# Patient Record
Sex: Female | Born: 1955 | Race: White | Hispanic: No | Marital: Married | Smoking: Current some day smoker
Health system: Southern US, Community
[De-identification: ages and names within clinical notes are randomized; demographics above are authoritative.]

## PROBLEM LIST (undated history)

## (undated) DIAGNOSIS — C4490 Unspecified malignant neoplasm of skin, unspecified: Secondary | ICD-10-CM

## (undated) DIAGNOSIS — E785 Hyperlipidemia, unspecified: Secondary | ICD-10-CM

## (undated) DIAGNOSIS — F419 Anxiety disorder, unspecified: Secondary | ICD-10-CM

## (undated) DIAGNOSIS — F32A Depression, unspecified: Secondary | ICD-10-CM

## (undated) DIAGNOSIS — J189 Pneumonia, unspecified organism: Secondary | ICD-10-CM

## (undated) DIAGNOSIS — I10 Essential (primary) hypertension: Secondary | ICD-10-CM

## (undated) HISTORY — PX: CHOLECYSTECTOMY: SHX55

## (undated) HISTORY — DX: Hyperlipidemia, unspecified: E78.5

## (undated) HISTORY — PX: LAPAROSCOPIC HYSTERECTOMY: SHX1926

## (undated) HISTORY — DX: Essential (primary) hypertension: I10

## (undated) HISTORY — PX: APPENDECTOMY: SHX54

## (undated) HISTORY — PX: DG GALL BLADDER: HXRAD326

---

## 1998-02-06 ENCOUNTER — Other Ambulatory Visit: Admission: RE | Admit: 1998-02-06 | Discharge: 1998-02-06 | Payer: Self-pay | Admitting: Obstetrics and Gynecology

## 1999-02-24 ENCOUNTER — Other Ambulatory Visit: Admission: RE | Admit: 1999-02-24 | Discharge: 1999-02-24 | Payer: Self-pay | Admitting: Obstetrics and Gynecology

## 2000-02-25 ENCOUNTER — Other Ambulatory Visit: Admission: RE | Admit: 2000-02-25 | Discharge: 2000-02-25 | Payer: Self-pay | Admitting: Obstetrics and Gynecology

## 2002-06-07 ENCOUNTER — Other Ambulatory Visit: Admission: RE | Admit: 2002-06-07 | Discharge: 2002-06-07 | Payer: Self-pay | Admitting: Obstetrics and Gynecology

## 2005-09-02 ENCOUNTER — Other Ambulatory Visit: Admission: RE | Admit: 2005-09-02 | Discharge: 2005-09-02 | Payer: Self-pay | Admitting: Obstetrics and Gynecology

## 2010-02-20 ENCOUNTER — Encounter: Admission: RE | Admit: 2010-02-20 | Discharge: 2010-02-20 | Payer: Self-pay | Admitting: Obstetrics and Gynecology

## 2016-01-23 ENCOUNTER — Telehealth: Payer: Self-pay | Admitting: Acute Care

## 2016-01-23 NOTE — Telephone Encounter (Signed)
Pt does not meet criteria for the lung cancer screening based off of the information provided by patient. Based off questionnaire pt only has a 19 pack year history of smoking which does not meet criteria for program. Will inform referring provider Suella Grove PA Telephone note encounter faxed over to her. Nothing further needed

## 2016-01-23 NOTE — Telephone Encounter (Signed)
Called patient regarding lung cancer screening program. Will sign off message.

## 2016-11-04 DIAGNOSIS — H9201 Otalgia, right ear: Secondary | ICD-10-CM | POA: Diagnosis not present

## 2016-11-20 DIAGNOSIS — H524 Presbyopia: Secondary | ICD-10-CM | POA: Diagnosis not present

## 2017-01-15 DIAGNOSIS — Z23 Encounter for immunization: Secondary | ICD-10-CM | POA: Diagnosis not present

## 2017-01-15 DIAGNOSIS — G43909 Migraine, unspecified, not intractable, without status migrainosus: Secondary | ICD-10-CM | POA: Diagnosis not present

## 2017-01-15 DIAGNOSIS — Z0000 Encounter for general adult medical examination without abnormal findings: Secondary | ICD-10-CM | POA: Diagnosis not present

## 2017-03-10 DIAGNOSIS — L814 Other melanin hyperpigmentation: Secondary | ICD-10-CM | POA: Diagnosis not present

## 2017-03-10 DIAGNOSIS — C44511 Basal cell carcinoma of skin of breast: Secondary | ICD-10-CM | POA: Diagnosis not present

## 2017-03-10 DIAGNOSIS — D485 Neoplasm of uncertain behavior of skin: Secondary | ICD-10-CM | POA: Diagnosis not present

## 2017-03-10 DIAGNOSIS — L821 Other seborrheic keratosis: Secondary | ICD-10-CM | POA: Diagnosis not present

## 2017-03-10 DIAGNOSIS — C44619 Basal cell carcinoma of skin of left upper limb, including shoulder: Secondary | ICD-10-CM | POA: Diagnosis not present

## 2017-03-10 DIAGNOSIS — D1801 Hemangioma of skin and subcutaneous tissue: Secondary | ICD-10-CM | POA: Diagnosis not present

## 2017-03-10 DIAGNOSIS — C44612 Basal cell carcinoma of skin of right upper limb, including shoulder: Secondary | ICD-10-CM | POA: Diagnosis not present

## 2017-04-30 DIAGNOSIS — C44511 Basal cell carcinoma of skin of breast: Secondary | ICD-10-CM | POA: Diagnosis not present

## 2017-06-02 DIAGNOSIS — C44519 Basal cell carcinoma of skin of other part of trunk: Secondary | ICD-10-CM | POA: Diagnosis not present

## 2017-06-23 DIAGNOSIS — C44519 Basal cell carcinoma of skin of other part of trunk: Secondary | ICD-10-CM | POA: Diagnosis not present

## 2017-06-28 DIAGNOSIS — Z01419 Encounter for gynecological examination (general) (routine) without abnormal findings: Secondary | ICD-10-CM | POA: Diagnosis not present

## 2017-10-07 DIAGNOSIS — R062 Wheezing: Secondary | ICD-10-CM | POA: Diagnosis not present

## 2017-10-07 DIAGNOSIS — J209 Acute bronchitis, unspecified: Secondary | ICD-10-CM | POA: Diagnosis not present

## 2017-10-07 DIAGNOSIS — R030 Elevated blood-pressure reading, without diagnosis of hypertension: Secondary | ICD-10-CM | POA: Diagnosis not present

## 2017-10-14 DIAGNOSIS — R062 Wheezing: Secondary | ICD-10-CM | POA: Diagnosis not present

## 2017-10-14 DIAGNOSIS — R05 Cough: Secondary | ICD-10-CM | POA: Diagnosis not present

## 2017-10-28 DIAGNOSIS — R062 Wheezing: Secondary | ICD-10-CM | POA: Diagnosis not present

## 2017-10-28 DIAGNOSIS — R030 Elevated blood-pressure reading, without diagnosis of hypertension: Secondary | ICD-10-CM | POA: Diagnosis not present

## 2017-11-04 DIAGNOSIS — R030 Elevated blood-pressure reading, without diagnosis of hypertension: Secondary | ICD-10-CM | POA: Diagnosis not present

## 2017-11-04 DIAGNOSIS — I700 Atherosclerosis of aorta: Secondary | ICD-10-CM | POA: Diagnosis not present

## 2017-11-04 DIAGNOSIS — R9431 Abnormal electrocardiogram [ECG] [EKG]: Secondary | ICD-10-CM | POA: Diagnosis not present

## 2017-11-12 DIAGNOSIS — I700 Atherosclerosis of aorta: Secondary | ICD-10-CM | POA: Diagnosis not present

## 2017-11-12 DIAGNOSIS — R9431 Abnormal electrocardiogram [ECG] [EKG]: Secondary | ICD-10-CM | POA: Diagnosis not present

## 2017-11-12 DIAGNOSIS — I10 Essential (primary) hypertension: Secondary | ICD-10-CM | POA: Diagnosis not present

## 2017-11-18 DIAGNOSIS — I10 Essential (primary) hypertension: Secondary | ICD-10-CM | POA: Diagnosis not present

## 2017-11-18 DIAGNOSIS — R9431 Abnormal electrocardiogram [ECG] [EKG]: Secondary | ICD-10-CM | POA: Diagnosis not present

## 2017-11-25 DIAGNOSIS — I700 Atherosclerosis of aorta: Secondary | ICD-10-CM | POA: Diagnosis not present

## 2017-11-25 DIAGNOSIS — I10 Essential (primary) hypertension: Secondary | ICD-10-CM | POA: Diagnosis not present

## 2017-12-06 DIAGNOSIS — I10 Essential (primary) hypertension: Secondary | ICD-10-CM | POA: Diagnosis not present

## 2017-12-07 DIAGNOSIS — L400 Psoriasis vulgaris: Secondary | ICD-10-CM | POA: Diagnosis not present

## 2017-12-07 DIAGNOSIS — C44629 Squamous cell carcinoma of skin of left upper limb, including shoulder: Secondary | ICD-10-CM | POA: Diagnosis not present

## 2017-12-07 DIAGNOSIS — D485 Neoplasm of uncertain behavior of skin: Secondary | ICD-10-CM | POA: Diagnosis not present

## 2017-12-07 DIAGNOSIS — L814 Other melanin hyperpigmentation: Secondary | ICD-10-CM | POA: Diagnosis not present

## 2017-12-07 DIAGNOSIS — L988 Other specified disorders of the skin and subcutaneous tissue: Secondary | ICD-10-CM | POA: Diagnosis not present

## 2017-12-31 DIAGNOSIS — Z1231 Encounter for screening mammogram for malignant neoplasm of breast: Secondary | ICD-10-CM | POA: Diagnosis not present

## 2018-01-03 DIAGNOSIS — D0462 Carcinoma in situ of skin of left upper limb, including shoulder: Secondary | ICD-10-CM | POA: Diagnosis not present

## 2018-01-21 DIAGNOSIS — I7090 Unspecified atherosclerosis: Secondary | ICD-10-CM | POA: Diagnosis not present

## 2018-01-21 DIAGNOSIS — Z136 Encounter for screening for cardiovascular disorders: Secondary | ICD-10-CM | POA: Diagnosis not present

## 2018-01-21 DIAGNOSIS — Z0000 Encounter for general adult medical examination without abnormal findings: Secondary | ICD-10-CM | POA: Diagnosis not present

## 2018-01-21 DIAGNOSIS — Z23 Encounter for immunization: Secondary | ICD-10-CM | POA: Diagnosis not present

## 2018-01-21 DIAGNOSIS — R030 Elevated blood-pressure reading, without diagnosis of hypertension: Secondary | ICD-10-CM | POA: Diagnosis not present

## 2018-07-26 DIAGNOSIS — L570 Actinic keratosis: Secondary | ICD-10-CM | POA: Diagnosis not present

## 2018-07-26 DIAGNOSIS — Z85828 Personal history of other malignant neoplasm of skin: Secondary | ICD-10-CM | POA: Diagnosis not present

## 2018-07-26 DIAGNOSIS — L814 Other melanin hyperpigmentation: Secondary | ICD-10-CM | POA: Diagnosis not present

## 2018-08-05 DIAGNOSIS — I7090 Unspecified atherosclerosis: Secondary | ICD-10-CM | POA: Diagnosis not present

## 2018-08-05 DIAGNOSIS — R030 Elevated blood-pressure reading, without diagnosis of hypertension: Secondary | ICD-10-CM | POA: Diagnosis not present

## 2018-08-05 DIAGNOSIS — F17200 Nicotine dependence, unspecified, uncomplicated: Secondary | ICD-10-CM | POA: Diagnosis not present

## 2018-08-10 DIAGNOSIS — Z681 Body mass index (BMI) 19 or less, adult: Secondary | ICD-10-CM | POA: Diagnosis not present

## 2018-08-10 DIAGNOSIS — Z01419 Encounter for gynecological examination (general) (routine) without abnormal findings: Secondary | ICD-10-CM | POA: Diagnosis not present

## 2018-09-16 DIAGNOSIS — H524 Presbyopia: Secondary | ICD-10-CM | POA: Diagnosis not present

## 2018-12-15 ENCOUNTER — Ambulatory Visit: Payer: Self-pay | Admitting: Nurse Practitioner

## 2018-12-15 VITALS — BP 130/78 | HR 83 | Temp 97.8°F | Resp 16 | Wt 118.2 lb

## 2018-12-15 DIAGNOSIS — B9689 Other specified bacterial agents as the cause of diseases classified elsewhere: Secondary | ICD-10-CM

## 2018-12-15 DIAGNOSIS — R197 Diarrhea, unspecified: Secondary | ICD-10-CM

## 2018-12-15 DIAGNOSIS — J019 Acute sinusitis, unspecified: Secondary | ICD-10-CM

## 2018-12-15 MED ORDER — AMOXICILLIN-POT CLAVULANATE 875-125 MG PO TABS: 1.0000 | ORAL_TABLET | Freq: Two times a day (BID) | ORAL | 0 refills | Status: AC

## 2018-12-15 MED ORDER — LOPERAMIDE HCL 2 MG PO TABS: 2.0000 mg | ORAL_TABLET | Freq: Four times a day (QID) | ORAL | 0 refills | Status: AC | PRN

## 2018-12-15 MED ORDER — PROMETHAZINE-DM 6.25-15 MG/5ML PO SYRP: 5.0000 mL | ORAL_SOLUTION | Freq: Four times a day (QID) | ORAL | 0 refills | Status: AC | PRN

## 2018-12-15 NOTE — Progress Notes (Signed)
Subjective:  Tonya Delgado is a 63 y.o. female who presents for evaluation of possible sinusitis.  Symptoms include bilateral ear pressure/pain, fever: suspected fevers but not measured at home, headache described as aching, nasal congestion, post nasal drip, productive cough with yellow and green colored sputum, purulent nasal drainage, sinus pressure, sinus pain and sore throat.  Patient also informs she has has episodes of diarrhea since the onset of her symptoms. Patient feels her sore throat is a result of her coughing and informs it has improved. Patient denies ear drainage, earache, abdominal pain, nausea or vomiting.  Patient also endorses decreased appetite, no recent travel.   Onset of symptoms was 9 days ago, and has been gradually worsening since that time.  Treatment to date:  acetaminophen, ibuprofen and nasal steroids.  High risk factors for influenza complications:  none.  Patient is a 20 pack-year smoker.  Patient admits to continued smoking while her symptoms have persisted.  The following portions of the patient's history were reviewed and updated as appropriate:  allergies, current medications and past medical history.  No past medical history on file.  Constitutional: positive for anorexia, fatigue and fevers, negative for chills, malaise and sweats Eyes: negative Ears, nose, mouth, throat, and face: positive for nasal congestion, sore throat and sinus pressure, sinus tenderness, right ear pressure/fullness, negative for ear drainage, earaches and hoarseness Respiratory: positive for cough and sputum, negative for asthma, chronic bronchitis, dyspnea on exertion, stridor and wheezing Cardiovascular: negative Gastrointestinal: positive for diarrhea and decreased appetite, negative for abdominal pain, constipation, nausea and vomiting Neurological: positive for headaches, negative for coordination problems, dizziness, gait problems, paresthesia, vertigo and  weakness Allergic/Immunologic: negative   Objective:  BP 130/78 (BP Location: Right Arm, Patient Position: Sitting, Cuff Size: Normal)   Pulse 83   Temp 97.8 F (36.6 C) (Oral)   Resp 16   Wt 118 lb 3.2 oz (53.6 kg)   SpO2 95%  Physical Exam Vitals signs reviewed.  Constitutional:      General: She is not in acute distress. HENT:     Head: Normocephalic.     Right Ear: Hearing, ear canal and external ear normal. A middle ear effusion is present.     Left Ear: Hearing, tympanic membrane, ear canal and external ear normal.  No middle ear effusion.     Nose: Congestion (moderate) present.     Right Turbinates: Enlarged and swollen.     Left Turbinates: Enlarged and swollen.     Right Sinus: Maxillary sinus tenderness and frontal sinus tenderness present.     Left Sinus: Maxillary sinus tenderness and frontal sinus tenderness present.     Mouth/Throat:     Mouth: Mucous membranes are moist.  Eyes:     Pupils: Pupils are equal, round, and reactive to light.  Neck:     Musculoskeletal: Normal range of motion and neck supple. No neck rigidity.  Cardiovascular:     Rate and Rhythm: Normal rate and regular rhythm.     Pulses: Normal pulses.     Heart sounds: Normal heart sounds.  Pulmonary:     Effort: Pulmonary effort is normal. No respiratory distress.     Breath sounds: Normal breath sounds. No stridor. No wheezing, rhonchi or rales.  Abdominal:     General: Abdomen is flat. Bowel sounds are normal. There is no distension.     Tenderness: There is no abdominal tenderness.  Lymphadenopathy:     Cervical: No cervical adenopathy.  Skin:  General: Skin is warm and dry.     Capillary Refill: Capillary refill takes less than 2 seconds.  Neurological:     General: No focal deficit present.     Mental Status: She is alert and oriented to person, place, and time.     Cranial Nerves: No cranial nerve deficit.  Psychiatric:        Mood and Affect: Mood normal.        Thought  Content: Thought content normal.     Assessment:  Acute Bacterial Sinusitis  Diarrhea    Plan:   Exam findings, diagnosis etiology and medication use and indications reviewed with patient. Follow- Up and discharge instructions provided. No emergent/urgent issues found on exam.  Based on the patient's clinical presentation, symptoms, duration of symptoms, and physical exam, patient's findings are consistent with that of bacterial etiology. Patient had purulent nasal drainage and irresolution of her symptoms for 9 days with worsening.  I am also going to cover the patient with an antibiotic due to her smoking history and advanced age.  Will cover the patient with Augmentin for a bacterial sinusitis. I will also provide the patient with symptomatic treatment for her cough and diarrhea which will include Promethazine DM for nighttime cough and Imodium right ear.  Informed patient that if her diarrhea symptoms do not improve within the next 48 hours I would like her to follow-up.  Patient's vitals were stable at discharge and patient is in no acute distress, lungs CTAB.  There is no concern for pneumonia, the patient most likely does have an underlying history of COPD due to her long history of smoking.  Patient was instructed to follow-up with her PCP if symptoms do not improve.  Patient education was provided. Patient verbalized understanding of information provided and agrees with plan of care (POC), all questions answered. The patient is advised to call or return to clinic if condition does not see an improvement in symptoms, or to seek the care of the closest emergency department if condition worsens with the above plan.    1. Acute bacterial sinusitis  - amoxicillin-clavulanate (AUGMENTIN) 875-125 MG tablet; Take 1 tablet by mouth 2 (two) times daily for 7 days.  Dispense: 14 tablet; Refill: 0 - promethazine-dextromethorphan (PROMETHAZINE-DM) 6.25-15 MG/5ML syrup; Take 5 mLs by mouth 4 (four) times  daily as needed for up to 7 days.  Dispense: 140 mL; Refill: 0 Take medication as prescribed. -Ibuprofen or Tylenol for pain, fever, or general discomfort. -Continue using the Nasacort you have at home. -Use Coricidin HBP cough medicine for daytime cough relief. Use the promethazine-DM for nighttime cough. -Get plenty of rest. -Try not to smoke while your symptoms persist.  I would also like you to consider trying to quit.  -Increase fluids. -Sleep elevated on at least 2 pillows at bedtime to help with cough. -Use a humidifier or vaporizer when at home and during sleep. -May use a teaspoon of honey or over-the-counter cough drops to help with cough. -May use normal saline nasal spray to help with nasal congestion throughout the day. -Follow-up if symptoms do not improve.  2. Diarrhea, unspecified type  - loperamide (IMODIUM A-D) 2 MG tablet; Take 1 tablet (2 mg total) by mouth 4 (four) times daily as needed for up to 3 days for diarrhea or loose stools. Do not exceed 16mg  in a 24hr period.  Dispense: 12 tablet; Refill: 0 -If your diarrhea does not improve in the next 48 hours, I would like you  to follow up with your regular doctor.

## 2018-12-15 NOTE — Patient Instructions (Signed)
Sinusitis, Adult  -Take medication as prescribed. -Ibuprofen or Tylenol for pain, fever, or general discomfort.  -Continue using the Nasacort you have at home. -Use Coricidin HBP cough medicine for daytime cough relief. Use the promethazine-DM for nighttime cough. -Get plenty of rest. -Try not to smoke while your symptoms persist.  I would also like you to consider trying to quit.  -Increase fluids. -Sleep elevated on at least 2 pillows at bedtime to help with cough. -Use a humidifier or vaporizer when at home and during sleep. -May use a teaspoon of honey or over-the-counter cough drops to help with cough. -May use normal saline nasal spray to help with nasal congestion throughout the day. -If your diarrhea does not improve in the next 48 hours, I would like you to follow up with your regular doctor.   -Follow-up if symptoms do not improve.     Sinusitis is inflammation of your sinuses. Sinuses are hollow spaces in the bones around your face. Your sinuses are located:  Around your eyes.  In the middle of your forehead.  Behind your nose.  In your cheekbones. Mucus normally drains out of your sinuses. When your nasal tissues become inflamed or swollen, mucus can become trapped or blocked. This allows bacteria, viruses, and fungi to grow, which leads to infection. Most infections of the sinuses are caused by a virus. Sinusitis can develop quickly. It can last for up to 4 weeks (acute) or for more than 12 weeks (chronic). Sinusitis often develops after a cold. What are the causes? This condition is caused by anything that creates swelling in the sinuses or stops mucus from draining. This includes:  Allergies.  Asthma.  Infection from bacteria or viruses.  Deformities or blockages in your nose or sinuses.  Abnormal growths in the nose (nasal polyps).  Pollutants, such as chemicals or irritants in the air.  Infection from fungi (rare). What increases the risk? You are more  likely to develop this condition if you:  Have a weak body defense system (immune system).  Do a lot of swimming or diving.  Overuse nasal sprays.  Smoke. What are the signs or symptoms? The main symptoms of this condition are pain and a feeling of pressure around the affected sinuses. Other symptoms include:  Stuffy nose or congestion.  Thick drainage from your nose.  Swelling and warmth over the affected sinuses.  Headache.  Upper toothache.  A cough that may get worse at night.  Extra mucus that collects in the throat or the back of the nose (postnasal drip).  Decreased sense of smell and taste.  Fatigue.  A fever.  Sore throat.  Bad breath. How is this diagnosed? This condition is diagnosed based on:  Your symptoms.  Your medical history.  A physical exam.  Tests to find out if your condition is acute or chronic. This may include: ? Checking your nose for nasal polyps. ? Viewing your sinuses using a device that has a light (endoscope). ? Testing for allergies or bacteria. ? Imaging tests, such as an MRI or CT scan. In rare cases, a bone biopsy may be done to rule out more serious types of fungal sinus disease. How is this treated? Treatment for sinusitis depends on the cause and whether your condition is chronic or acute.  If caused by a virus, your symptoms should go away on their own within 10 days. You may be given medicines to relieve symptoms. They include: ? Medicines that shrink swollen nasal passages (topical  intranasal decongestants). ? Medicines that treat allergies (antihistamines). ? A spray that eases inflammation of the nostrils (topical intranasal corticosteroids). ? Rinses that help get rid of thick mucus in your nose (nasal saline washes).  If caused by bacteria, your health care provider may recommend waiting to see if your symptoms improve. Most bacterial infections will get better without antibiotic medicine. You may be given  antibiotics if you have: ? A severe infection. ? A weak immune system.  If caused by narrow nasal passages or nasal polyps, you may need to have surgery. Follow these instructions at home: Medicines  Take, use, or apply over-the-counter and prescription medicines only as told by your health care provider. These may include nasal sprays.  If you were prescribed an antibiotic medicine, take it as told by your health care provider. Do not stop taking the antibiotic even if you start to feel better. Hydrate and humidify   Drink enough fluid to keep your urine pale yellow. Staying hydrated will help to thin your mucus.  Use a cool mist humidifier to keep the humidity level in your home above 50%.  Inhale steam for 10-15 minutes, 3-4 times a day, or as told by your health care provider. You can do this in the bathroom while a hot shower is running.  Limit your exposure to cool or dry air. Rest  Rest as much as possible.  Sleep with your head raised (elevated).  Make sure you get enough sleep each night. General instructions   Apply a warm, moist washcloth to your face 3-4 times a day or as told by your health care provider. This will help with discomfort.  Wash your hands often with soap and water to reduce your exposure to germs. If soap and water are not available, use hand sanitizer.  Do not smoke. Avoid being around people who are smoking (secondhand smoke).  Keep all follow-up visits as told by your health care provider. This is important. Contact a health care provider if:  You have a fever.  Your symptoms get worse.  Your symptoms do not improve within 10 days. Get help right away if:  You have a severe headache.  You have persistent vomiting.  You have severe pain or swelling around your face or eyes.  You have vision problems.  You develop confusion.  Your neck is stiff.  You have trouble breathing. Summary  Sinusitis is soreness and inflammation of  your sinuses. Sinuses are hollow spaces in the bones around your face.  This condition is caused by nasal tissues that become inflamed or swollen. The swelling traps or blocks the flow of mucus. This allows bacteria, viruses, and fungi to grow, which leads to infection.  If you were prescribed an antibiotic medicine, take it as told by your health care provider. Do not stop taking the antibiotic even if you start to feel better.  Keep all follow-up visits as told by your health care provider. This is important. This information is not intended to replace advice given to you by your health care provider. Make sure you discuss any questions you have with your health care provider. Document Released: 10/19/2005 Document Revised: 03/21/2018 Document Reviewed: 03/21/2018 Elsevier Interactive Patient Education  2019 [employer] Choices to Help Relieve Diarrhea, Adult When you have diarrhea, the foods you eat and your eating habits are very important. Choosing the right foods and drinks can help:  Relieve diarrhea.  Replace lost fluids and nutrients.  Prevent dehydration. What general  guidelines should I follow?  Relieving diarrhea  Choose foods with less than 2 g or .07 oz. of fiber per serving.  Limit fats to less than 8 tsp (38 g or 1.34 oz.) a day.  Avoid the following: ? Foods and beverages sweetened with high-fructose corn syrup, honey, or sugar alcohols such as xylitol, sorbitol, and mannitol. ? Foods that contain a lot of fat or sugar. ? Fried, greasy, or spicy foods. ? High-fiber grains, breads, and cereals. ? Raw fruits and vegetables.  Eat foods that are rich in probiotics. These foods include dairy products such as yogurt and fermented milk products. They help increase healthy bacteria in the stomach and intestines (gastrointestinal tract, or GI tract).  If you have lactose intolerance, avoid dairy products. These may make your diarrhea worse.  Take medicine to  help stop diarrhea (antidiarrheal medicine) only as told by your health care provider. Replacing nutrients  Eat small meals or snacks every 3-4 hours.  Eat bland foods, such as white rice, toast, or baked potato, until your diarrhea starts to get better. Gradually reintroduce nutrient-rich foods as tolerated or as told by your health care provider. This includes: ? Well-cooked protein foods. ? Peeled, seeded, and soft-cooked fruits and vegetables. ? Low-fat dairy products.  Take vitamin and mineral supplements as told by your health care provider. Preventing dehydration  Start by sipping water or a special solution to prevent dehydration (oral rehydration solution, ORS). Urine that is clear or pale yellow means that you are getting enough fluid.  Try to drink at least 8-10 cups of fluid each day to help replace lost fluids.  You may add other liquids in addition to water, such as clear juice or decaffeinated sports drinks, as tolerated or as told by your health care provider.  Avoid drinks with caffeine, such as coffee, tea, or soft drinks.  Avoid alcohol. What foods are recommended?     The items listed may not be a complete list. Talk with your health care provider about what dietary choices are best for you. Grains White rice. White, [place], or pita breads (fresh or toasted), including plain rolls, buns, or bagels. White pasta. Saltine, soda, or graham crackers. Pretzels. Low-fiber cereal. Cooked cereals made with water (such as cornmeal, farina, or cream cereals). Plain muffins. Matzo. Melba toast. Zwieback. Vegetables Potatoes (without the skin). Most well-cooked and canned vegetables without skins or seeds. Tender lettuce. Fruits Apple sauce. Fruits canned in juice. Cooked apricots, cherries, grapefruit, peaches, pears, or plums. Fresh bananas and cantaloupe. Meats and other protein foods Baked or boiled chicken. Eggs. Tofu. Fish. Seafood. Smooth nut butters. Ground or  well-cooked tender beef, ham, veal, lamb, pork, or poultry. Dairy Plain yogurt, kefir, and unsweetened liquid yogurt. Lactose-free milk, buttermilk, skim milk, or soy milk. Low-fat or nonfat hard cheese. Beverages Water. Low-calorie sports drinks. Fruit juices without pulp. Strained tomato and vegetable juices. Decaffeinated teas. Sugar-free beverages not sweetened with sugar alcohols. Oral rehydration solutions, if approved by your health care provider. Seasoning and other foods Bouillon, broth, or soups made from recommended foods. What foods are not recommended? The items listed may not be a complete list. Talk with your health care provider about what dietary choices are best for you. Grains Whole grain, whole wheat, bran, or rye breads, rolls, pastas, and crackers. Wild or brown rice. Whole grain or bran cereals. Barley. Oats and oatmeal. Corn tortillas or taco shells. Granola. Popcorn. Vegetables Raw vegetables. Fried vegetables. Cabbage, broccoli, Brussels sprouts, artichokes, baked beans,  beet greens, corn, kale, legumes, peas, sweet potatoes, and yams. Potato skins. Cooked spinach and cabbage. Fruits Dried fruit, including raisins and dates. Raw fruits. Stewed or dried prunes. Canned fruits with syrup. Meat and other protein foods Fried or fatty meats. Deli meats. Chunky nut butters. Nuts and seeds. Beans and lentils. Berniece Salines. Hot dogs. Sausage. Dairy High-fat cheeses. Whole milk, chocolate milk, and beverages made with milk, such as milk shakes. Half-and-half. Cream. sour cream. Ice cream. Beverages Caffeinated beverages (such as coffee, tea, soda, or energy drinks). Alcoholic beverages. Fruit juices with pulp. Prune juice. Soft drinks sweetened with high-fructose corn syrup or sugar alcohols. High-calorie sports drinks. Fats and oils Butter. Cream sauces. Margarine. Salad oils. Plain salad dressings. Olives. Avocados. Mayonnaise. Sweets and desserts Sweet rolls, doughnuts, and sweet  breads. Sugar-free desserts sweetened with sugar alcohols such as xylitol and sorbitol. Seasoning and other foods Honey. Hot sauce. Chili powder. Gravy. Cream-based or milk-based soups. Pancakes and waffles. Summary  When you have diarrhea, the foods you eat and your eating habits are very important.  Make sure you get at least 8-10 cups of fluid each day, or enough to keep your urine clear or pale yellow.  Eat bland foods and gradually reintroduce healthy, nutrient-rich foods as tolerated, or as told by your health care provider.  Avoid high-fiber, fried, greasy, or spicy foods. This information is not intended to replace advice given to you by your health care provider. Make sure you discuss any questions you have with your health care provider. Document Released: 01/09/2004 Document Revised: 10/16/2016 Document Reviewed: 10/16/2016 Elsevier Interactive Patient Education  2019 [employer].

## 2019-10-19 ENCOUNTER — Other Ambulatory Visit: Payer: Self-pay | Admitting: Cardiology

## 2019-10-19 DIAGNOSIS — I700 Atherosclerosis of aorta: Secondary | ICD-10-CM

## 2019-10-19 DIAGNOSIS — I10 Essential (primary) hypertension: Secondary | ICD-10-CM

## 2019-10-19 DIAGNOSIS — I422 Other hypertrophic cardiomyopathy: Secondary | ICD-10-CM

## 2019-11-17 ENCOUNTER — Other Ambulatory Visit: Payer: Self-pay

## 2019-11-17 ENCOUNTER — Ambulatory Visit (INDEPENDENT_AMBULATORY_CARE_PROVIDER_SITE_OTHER): Payer: 59

## 2019-11-17 DIAGNOSIS — I422 Other hypertrophic cardiomyopathy: Secondary | ICD-10-CM

## 2019-11-17 DIAGNOSIS — I700 Atherosclerosis of aorta: Secondary | ICD-10-CM

## 2019-11-17 DIAGNOSIS — I10 Essential (primary) hypertension: Secondary | ICD-10-CM | POA: Diagnosis not present

## 2019-11-23 NOTE — Progress Notes (Signed)
Primary Physician:  Marda Stalker, PA-C   Patient ID: Tonya Delgado, female    DOB: 02/13/1956, 64 y.o.   MRN: [id number]  Subjective:    Chief Complaint  Patient presents with  . Hypertension  . Follow-up    HPI: Tonya Delgado  is a 64 y.o. female  with family history of heart disease, tobacco use, aortic atherosclerosis, noted to mild septal hypertrophy on echo in 2019 felt to be likely related to hypertension or age; however, recommended repeating echocardiogram in 2 years for continued surveillance of any hypertrophic cardiomyopathy. She now presents for 2 year follow up.  Patient is without complaint. She denies any chest pain, shortness of breath, syncope, palpitations, or leg edema. She denies any history of hyperlipidemia, hypertension, or diabetes. She does walk 4 times a week without difficulty.   She is a current half a pack per day smoker, has not been able to quit. She continues to work on this. Family history is positive for heart disease.  Past Medical History:  Diagnosis Date  . Hyperlipidemia   . Hypertension     Past Surgical History:  Procedure Laterality Date  . CESAREAN SECTION     x 2  . DG GALL BLADDER    . LAPAROSCOPIC HYSTERECTOMY      Social History   Socioeconomic History  . Marital status: Married    Spouse name: Not on file  . Number of children: 3  . Years of education: Not on file  . Highest education level: Not on file  Occupational History  . Not on file  Tobacco Use  . Smoking status: Current Every Day Smoker    Packs/day: 0.50    Types: Cigarettes  . Smokeless tobacco: Never Used  Substance and Sexual Activity  . Alcohol use: Not on file    Comment: social  . Drug use: Not Currently  . Sexual activity: Not on file  Other Topics Concern  . Not on file  Social History Narrative  . Not on file   Social Determinants of Health   Financial Resource Strain:   . Difficulty of Paying Living Expenses: Not on file  Food  Insecurity:   . Worried About Charity fundraiser in the Last Year: Not on file  . Ran Out of Food in the Last Year: Not on file  Transportation Needs:   . Lack of Transportation (Medical): Not on file  . Lack of Transportation (Non-Medical): Not on file  Physical Activity:   . Days of Exercise per Week: Not on file  . Minutes of Exercise per Session: Not on file  Stress:   . Feeling of Stress : Not on file  Social Connections:   . Frequency of Communication with Friends and Family: Not on file  . Frequency of Social Gatherings with Friends and Family: Not on file  . Attends Religious Services: Not on file  . Active Member of Clubs or Organizations: Not on file  . Attends Archivist Meetings: Not on file  . Marital Status: Not on file  Intimate Partner Violence:   . Fear of Current or Ex-Partner: Not on file  . Emotionally Abused: Not on file  . Physically Abused: Not on file  . Sexually Abused: Not on file    Review of Systems  Constitution: Negative for decreased appetite, malaise/fatigue, weight gain and weight loss.  Eyes: Negative for visual disturbance.  Cardiovascular: Negative for chest pain, claudication, dyspnea on exertion, leg swelling, orthopnea,  palpitations and syncope.  Respiratory: Negative for hemoptysis and wheezing.   Endocrine: Negative for cold intolerance and heat intolerance.  Hematologic/Lymphatic: Does not bruise/bleed easily.  Skin: Negative for nail changes.  Musculoskeletal: Negative for muscle weakness and myalgias.  Gastrointestinal: Negative for abdominal pain, change in bowel habit, nausea and vomiting.  Neurological: Negative for difficulty with concentration, dizziness, focal weakness and headaches.  Psychiatric/Behavioral: Negative for altered mental status and suicidal ideas.  All other systems reviewed and are negative.     Objective:  Blood pressure 139/72, pulse 70, temperature 98.6 F (37 C), height '5\' 5"'  (1.651 m), weight  122 lb (55.3 kg), SpO2 97 %. Body mass index is 20.3 kg/m.    Physical Exam  Constitutional: She is oriented to person, place, and time. Vital signs are normal. She appears well-developed and well-nourished.  HENT:  Head: Normocephalic and atraumatic.  Cardiovascular: Normal rate, regular rhythm and intact distal pulses.  Murmur heard.  Midsystolic murmur is present with a grade of 2/6 at the apex. Pulmonary/Chest: Effort normal and breath sounds normal. No accessory muscle usage. No respiratory distress.  Abdominal: Soft. Bowel sounds are normal.  Musculoskeletal:        General: Normal range of motion.     Cervical back: Normal range of motion.  Neurological: She is alert and oriented to person, place, and time.  Skin: Skin is warm and dry.  Vitals reviewed.  Radiology: No results found.  Laboratory examination:   02/03/2019: Cholesterol 154, trig 60, HDL 81, LDL 61. CMP normal.   12/07/2017: Creatinine 0.59, EGFR 100/115, potassium 4.4, glucose 112, BMP otherwise normal.  01/15/2017: Cholesterol 181, triglycerides 46, HDL 88, LDL 84.  Creatinine 0.64, EGFR 95/115, potassium 4.4, CMP normal.  Platelet count 417, CBC otherwise normal.  No flowsheet data found. No flowsheet data found. Lipid Panel  No results found for: CHOL, TRIG, HDL, CHOLHDL, VLDL, LDLCALC, LDLDIRECT HEMOGLOBIN A1C No results found for: HGBA1C, MPG TSH No results for input(s): TSH in the last 8760 hours.  PRN Meds:. There are no discontinued medications. Current Meds  Medication Sig  . atorvastatin (LIPITOR) 10 MG tablet Take 10 mg by mouth daily.  . busPIRone (BUSPAR) 10 MG tablet Take 10 mg by mouth 3 (three) times daily.  Marland Kitchen estrogens, conjugated, (PREMARIN) 0.625 MG tablet Take 0.625 mg by mouth daily. Take daily for 21 days then do not take for 7 days.  Marland Kitchen losartan (COZAAR) 25 MG tablet Take 12.5 mg by mouth daily.  . sertraline (ZOLOFT) 50 MG tablet Take 50 mg by mouth daily.  . SUMAtriptan  (IMITREX) 100 MG tablet Take 100 mg by mouth every 2 (two) hours as needed for migraine. May repeat in 2 hours if headache persists or recurs.  . traMADol (ULTRAM) 50 MG tablet Take 50 mg by mouth every 6 (six) hours as needed.    Cardiac Studies:   Echocardiogram 11/17/2019:  Normal LV systolic function with EF 64%. Left ventricle cavity is normal in size. Moderate concentric hypertrophy of the left ventricle. Normal global wall motion. Normal diastolic filling pattern. Calculated EF 64%. Left atrial cavity is mildly dilated by volume.  The interatrial Septum is thin and mobile but appears to be intact by 2D and CF Doppler interrogation. Structurally normal mitral valve.  Mild (Grade I) mitral regurgitation. Trace tricuspid regurgitation. No evidence of pulmonary hypertension. No significant change from 11/18/2017.   Assessment:   Essential hypertension - Plan: EKG 12-Lead  Pure hypercholesterolemia  Nonrheumatic mitral valve regurgitation  Tobacco use disorder  EKG 11/24/2019: Normal sinus rhythm at 69 bpm, borderline right atrial enlargement, normal axis, PRWP cannot exclude anterior infarct old, but likely normal variant. No evidence of ischemia.  Recommendations:   Patient is doing well without any complaints today.  She reports that her labs with her PCP have all been stable including her lipids.  Continue with Lipitor 10 mg.  I will request recent labs for records.  Her blood pressure is well controlled.  She continues to exercise regularly, encouraged her to continue with this.  I discussed recently obtained echocardiogram, no changes compared to 2019.  Strongly encouraged her to work on quitting smoking.  Discussed continued smoking risk.  Prefers to not start medication at this point.  She will continue to follow-up with her PCP also regarding this.  We will see her back on a as needed basis.  *addendum: Cholesterol is well controlled.   Miquel Dunn, MSN, APRN,  FNP-C [facility]. [place] Office: [phone] Fax: [phone]

## 2019-11-24 ENCOUNTER — Ambulatory Visit (INDEPENDENT_AMBULATORY_CARE_PROVIDER_SITE_OTHER): Payer: 59 | Admitting: Cardiology

## 2019-11-24 ENCOUNTER — Other Ambulatory Visit: Payer: Self-pay

## 2019-11-24 ENCOUNTER — Encounter: Payer: Self-pay | Admitting: Cardiology

## 2019-11-24 ENCOUNTER — Telehealth: Payer: Self-pay

## 2019-11-24 VITALS — BP 139/72 | HR 70 | Temp 98.6°F | Ht 65.0 in | Wt 122.0 lb

## 2019-11-24 DIAGNOSIS — I10 Essential (primary) hypertension: Secondary | ICD-10-CM

## 2019-11-24 DIAGNOSIS — F17210 Nicotine dependence, cigarettes, uncomplicated: Secondary | ICD-10-CM | POA: Diagnosis not present

## 2019-11-24 DIAGNOSIS — I340 Nonrheumatic mitral (valve) insufficiency: Secondary | ICD-10-CM

## 2019-11-24 DIAGNOSIS — E7800 Pure hypercholesterolemia, unspecified: Secondary | ICD-10-CM

## 2019-11-24 DIAGNOSIS — F17200 Nicotine dependence, unspecified, uncomplicated: Secondary | ICD-10-CM

## 2020-01-26 ENCOUNTER — Ambulatory Visit: Payer: Self-pay | Attending: Internal Medicine

## 2020-01-26 DIAGNOSIS — Z23 Encounter for immunization: Secondary | ICD-10-CM

## 2020-01-26 NOTE — Progress Notes (Signed)
   Covid-19 Vaccination Clinic  Name:  CLARECE SZEKERES    MRN: [id number] DOB: [birth year]  01/26/2020  Ms. Joe was observed post Covid-19 immunization for 15 minutes without incident. She was provided with Vaccine Information Sheet and instruction to access the V-Safe system.   Ms. Pozo was instructed to call 911 with any severe reactions post vaccine: Marland Kitchen Difficulty breathing  . Swelling of face and throat  . A fast heartbeat  . A bad rash all over body  . Dizziness and weakness   Immunizations Administered    Name Date Dose VIS Date Route   Pfizer COVID-19 Vaccine 01/26/2020 10:22 AM 0.3 mL 10/13/2019 Intramuscular   Manufacturer: [place]   Lot: [id number]   [place]: [id number]

## 2020-02-20 ENCOUNTER — Ambulatory Visit: Payer: Self-pay | Attending: Internal Medicine

## 2020-02-20 DIAGNOSIS — Z23 Encounter for immunization: Secondary | ICD-10-CM

## 2020-02-20 NOTE — Progress Notes (Signed)
   Covid-19 Vaccination Clinic  Name:  Tonya Delgado    MRN: [id number] DOB: [birth year]  02/20/2020  [name] was observed post Covid-19 immunization for 15 minutes without incident. She was provided with Vaccine Information Sheet and instruction to access the V-Safe system.   [name] was instructed to call 911 with any severe reactions post vaccine: Marland Kitchen Difficulty breathing  . Swelling of face and throat  . A fast heartbeat  . A bad rash all over body  . Dizziness and weakness   Immunizations Administered    Name Date Dose VIS Date Route   Pfizer COVID-19 Vaccine 02/20/2020 11:29 AM 0.3 mL 12/27/2018 Intramuscular   Manufacturer: [place]   Lot: [id number]   [place]: [id number]

## 2021-04-22 ENCOUNTER — Other Ambulatory Visit: Payer: Self-pay | Admitting: Physician Assistant

## 2021-04-22 DIAGNOSIS — K219 Gastro-esophageal reflux disease without esophagitis: Secondary | ICD-10-CM

## 2021-04-26 ENCOUNTER — Ambulatory Visit
Admission: RE | Admit: 2021-04-26 | Discharge: 2021-04-26 | Disposition: A | Discharge status: Home / Self Care | Payer: BC Managed Care – PPO | Source: Ambulatory Visit | Attending: Physician Assistant | Admitting: Physician Assistant

## 2021-04-26 ENCOUNTER — Other Ambulatory Visit: Payer: Self-pay

## 2021-04-26 DIAGNOSIS — K219 Gastro-esophageal reflux disease without esophagitis: Secondary | ICD-10-CM

## 2021-12-03 ENCOUNTER — Other Ambulatory Visit: Payer: Self-pay

## 2021-12-03 ENCOUNTER — Ambulatory Visit: Payer: BC Managed Care – PPO | Admitting: Plastic Surgery

## 2021-12-03 ENCOUNTER — Encounter: Payer: Self-pay | Admitting: Plastic Surgery

## 2021-12-03 VITALS — BP 121/82 | HR 94 | Ht 65.0 in | Wt 122.6 lb

## 2021-12-03 DIAGNOSIS — C44319 Basal cell carcinoma of skin of other parts of face: Secondary | ICD-10-CM | POA: Diagnosis not present

## 2021-12-03 NOTE — Progress Notes (Signed)
Oncology Nurse Navigator Documentation   Placed introductory call to new referral patient Tonya Delgado. Introduced myself as the H&N oncology nurse navigator that works with Dr. Isidore Moos to whom she has been referred by Dr. Winifred Olive. She confirmed understanding of referral. Briefly explained my role as her navigator, provided my contact information.  Confirmed understanding of upcoming appts and [name] location, explained arrival and registration process. I encouraged her to call with questions/concerns as she moves forward with appts and procedures.   She verbalized understanding of information provided, expressed appreciation for my call.   Navigator Initial Assessment Employment Status: she is employed Currently on [employer] / STD:no Living Situation:she lives with her husband Support System: husband, family PCP: Marda Stalker NP PCD: na Financial Concerns:no Transportation Needs: no Sensory Deficits: Engineer, building services Needed:  no Ambulation Needs: no DME Used in Home: no Psychosocial Needs:  no Concerns/Needs Understanding Cancer:  addressed/answered by navigator to best of ability Self-Expressed Needs: no   Clinical biochemist, BSN, OCN Head & Neck Oncology Nurse [place] [facility] Phone # [phone]  Fax # [phone]

## 2021-12-03 NOTE — Progress Notes (Signed)
° °  Referring Provider Marda Stalker, PA-C [place],  [place] [address]   CC: Tonya Delgado defect status post Mohs    Tonya Delgado is an 66 y.o. female.  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  She had this before with complex reconstruction by dermatology.  She had Mohs excision most recently by Dr. Zenaida Niece.  Margins were focally positive over a portion of bone so removal of this margin likely with burring is indicated along with reconstruction.  No Known Allergies  Outpatient Encounter Medications as of 12/03/2021  Medication Sig   atorvastatin (LIPITOR) 10 MG tablet Take 10 mg by mouth every evening.   busPIRone (BUSPAR) 10 MG tablet Take 10 mg by mouth at bedtime.   estrogens, conjugated, (PREMARIN) 0.625 MG tablet Take 0.3125 mg by mouth every evening.   losartan (COZAAR) 25 MG tablet Take 12.5 mg by mouth every evening.   sertraline (ZOLOFT) 50 MG tablet Take 75 mg by mouth every evening.   SUMAtriptan (IMITREX) 100 MG tablet Take 100 mg by mouth every 2 (two) hours as needed for migraine (max 2 doses/24hrs.). May repeat in 2 hours if headache persists or recurs.   traMADol (ULTRAM) 50 MG tablet Take 50 mg by mouth daily as needed (migraine headaches.).   No facility-administered encounter medications on file as of 12/03/2021.     Past Medical History:  Diagnosis Date   Hyperlipidemia    Hypertension     Past Surgical History:  Procedure Laterality Date   CESAREAN SECTION     x 2   DG GALL BLADDER     LAPAROSCOPIC HYSTERECTOMY      Family History  Problem Relation Age of Onset   Hyperlipidemia Mother    Hypertension Mother    Lung cancer Father    Heart failure Father    Hypertension Sister    Lung cancer Sister    Hypertension Brother     Social history: Patient is occasionally using tobacco.  No illicit drugs.  Review of Systems General: Denies fevers, chills, weight loss CV: Denies  chest pain, shortness of breath, palpitations   Physical Exam Vitals with BMI 12/03/2021 11/24/2019 12/15/2018  Height 5\' 5"  5\' 5"  -  Weight 122 lbs 10 oz 122 lbs 118 lbs 3 oz  BMI 35.4 56.2 -  Systolic 563 893 734  Diastolic 82 72 78  Pulse 94 70 83    General:  No acute distress,  Alert and oriented, Non-Toxic, Normal speech and affect Heent:  open wound chin, small mucosal defect, exposed bone    Assessment/Plan Burring additional margin, will recommend radiation given possible positive bone and history of tissue rearrangement and deep recurrenct.  Reconstruction will involve advancement or tissue rearrangement based on intraop tension.   Time based coding: 24minutes were spent with the patient.  Greater than 50% was spent on counseling cordination of care.  We discussed reconstruction options.   [name] 12/03/2021, 2:54 PM

## 2021-12-03 NOTE — H&P (View-Only) (Signed)
° °  Referring Provider Marda Stalker, PA-C [place],  [place] [address]   CC: Tonya Delgado defect status post Mohs    Tonya Delgado is an 66 y.o. female.  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  She had this before with complex reconstruction by dermatology.  She had Mohs excision most recently by Dr. Zenaida Niece.  Margins were focally positive over a portion of bone so removal of this margin likely with burring is indicated along with reconstruction.  No Known Allergies  Outpatient Encounter Medications as of 12/03/2021  Medication Sig   atorvastatin (LIPITOR) 10 MG tablet Take 10 mg by mouth every evening.   busPIRone (BUSPAR) 10 MG tablet Take 10 mg by mouth at bedtime.   estrogens, conjugated, (PREMARIN) 0.625 MG tablet Take 0.3125 mg by mouth every evening.   losartan (COZAAR) 25 MG tablet Take 12.5 mg by mouth every evening.   sertraline (ZOLOFT) 50 MG tablet Take 75 mg by mouth every evening.   SUMAtriptan (IMITREX) 100 MG tablet Take 100 mg by mouth every 2 (two) hours as needed for migraine (max 2 doses/24hrs.). May repeat in 2 hours if headache persists or recurs.   traMADol (ULTRAM) 50 MG tablet Take 50 mg by mouth daily as needed (migraine headaches.).   No facility-administered encounter medications on file as of 12/03/2021.     Past Medical History:  Diagnosis Date   Hyperlipidemia    Hypertension     Past Surgical History:  Procedure Laterality Date   CESAREAN SECTION     x 2   DG GALL BLADDER     LAPAROSCOPIC HYSTERECTOMY      Family History  Problem Relation Age of Onset   Hyperlipidemia Mother    Hypertension Mother    Lung cancer Father    Heart failure Father    Hypertension Sister    Lung cancer Sister    Hypertension Brother     Social history: Patient is occasionally using tobacco.  No illicit drugs.  Review of Systems General: Denies fevers, chills, weight loss CV: Denies  chest pain, shortness of breath, palpitations   Physical Exam Vitals with BMI 12/03/2021 11/24/2019 12/15/2018  Height 5\' 5"  5\' 5"  -  Weight 122 lbs 10 oz 122 lbs 118 lbs 3 oz  BMI 93.2 35.5 -  Systolic 732 202 542  Diastolic 82 72 78  Pulse 94 70 83    General:  No acute distress,  Alert and oriented, Non-Toxic, Normal speech and affect Heent:  open wound chin, small mucosal defect, exposed bone    Assessment/Plan Burring additional margin, will recommend radiation given possible positive bone and history of tissue rearrangement and deep recurrenct.  Reconstruction will involve advancement or tissue rearrangement based on intraop tension.   Time based coding: 46minutes were spent with the patient.  Greater than 50% was spent on counseling cordination of care.  We discussed reconstruction options.   [name] 12/03/2021, 2:54 PM

## 2021-12-04 ENCOUNTER — Other Ambulatory Visit: Payer: Self-pay

## 2021-12-04 ENCOUNTER — Encounter (HOSPITAL_COMMUNITY): Payer: Self-pay | Admitting: Plastic Surgery

## 2021-12-04 NOTE — Progress Notes (Signed)
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 [name] (Plastic Surgeon) SKIN GRAFT SPLIT THICKNESS ADJACENT TISSUE TRANSFER/TISSUE REARRANGEMENT APPLICATION OF SKIN SUBSTITUTE  12/01/2021 --Dr. Karin Golden Mohs surgery to right chin   Past skin cancers, if any: 1) Location/Histology/Intervention: Left side of nose: Mohs  2) Location/Histology/Intervention: Both shoulders: Mohs 3) Location/Histology/Intervention: Chest: Mohs  History of Blistering sunburns, if any: Yes, significant history of sun exposure   SAFETY ISSUES: Prior radiation? No Pacemaker/ICD? No Possible current pregnancy? No--hysterectomy Is the patient on methotrexate? No  Current Complaints / other details:  Nothing else of note

## 2021-12-04 NOTE — Progress Notes (Signed)
PCP - Marda Stalker, PA-C Cardiologist -  EKG - DOS Chest x-ray -  ECHO - 11/17/19 Cardiac Cath -  CPAP -   ERAS Protcol - clears 1200 COVID TEST- n/a  Anesthesia review: n/a  -------------  SDW INSTRUCTIONS:  Your procedure is scheduled on 2/3. Please report to [facility] Main Entrance "A" at 12:30 A.M., and check in at the Admitting office. Call this number if you have problems the morning of surgery: [phone]   Remember: Do not eat after midnight the night before your surgery  You may drink clear liquids until 12:00 the day of your surgery.   Clear liquids allowed are: Water, Non-Citrus Juices (without pulp), Carbonated Beverages, Clear Tea, Black Coffee Only, and Gatorade (do NOT add anything to drinks)   Medications to take morning of surgery with a sip of water include: acetaminophen (TYLENOL) if needed SUMAtriptan (IMITREX) if needed traMADol (ULTRAM) if needed  As of today, STOP taking any Aspirin (unless otherwise instructed by your surgeon), Aleve, Naproxen, Ibuprofen, Motrin, Advil, Goody's, BC's, all herbal medications, fish oil, and all vitamins.    The Morning of Surgery Do not wear jewelry, make-up or nail polish. Do not wear lotions, powders, or perfumes, or deodorant Do not bring valuables to the hospital. [facility] is not responsible for any belongings or valuables.  If you are a smoker, DO NOT Smoke 24 hours prior to surgery  If you wear a CPAP at night please bring your mask the morning of surgery   Remember that you must have someone to transport you home after your surgery, and remain with you for 24 hours if you are discharged the same day.  Please bring cases for contacts, glasses, hearing aids, dentures or bridgework because it cannot be worn into surgery.   Patients discharged the day of surgery will not be allowed to drive home.   Please shower the NIGHT BEFORE/MORNING OF SURGERY (use antibacterial soap like DIAL soap if possible).  Wear comfortable clothes the morning of surgery. Oral Hygiene is also important to reduce your risk of infection.  Remember - BRUSH YOUR TEETH THE MORNING OF SURGERY WITH YOUR REGULAR TOOTHPASTE  Patient denies shortness of breath, fever, cough and chest pain.

## 2021-12-04 NOTE — Progress Notes (Signed)
Radiation Oncology         [phone] ________________________________  Initial Outpatient Consultation  Name: Tonya Delgado MRN: [id number]  Date: 12/05/2021  DOB: [birth year]  [id number], Loma Sousa, PA-C  Karin Golden, MD   REFERRING PHYSICIAN: Karin Golden, MD  DIAGNOSIS:    ICD-10-CM   1. Basal cell carcinoma (BCC) of chin  C44.319     2. Basal cell carcinoma of chin  C44.319       Basal cell carcinoma -  chin   Cancer Staging  Basal cell carcinoma of chin Staging form: Cutaneous Carcinoma of the Head and Neck, AJCC 8th Edition - Pathologic stage from 12/05/2021: Stage IV (pT4a, pNX, cM0) - Signed by Eppie Gibson, MD on 12/08/2021 Extraosseous extension: Present   CHIEF COMPLAINT: Here to discuss management of skin cancer  HISTORY OF PRESENT ILLNESS::Tonya Delgado is a 66 y.o. 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 Winifred Olive.The scar was then revised with a rotation flap and now the patient has a recurrence. Dr. Winifred Olive is concerned the rotation flap that was done for scar revision created a discontinuous tumor so Mohs may not give her the high cure rate that it is quoted.    She presented to Dr. Winifred Olive for a right skin shave biopsy on 11/20/21 which revealed infiltrative, ulcerated, basal cell carcinoma. She then underwent a Mohs procedure to this site on 12/01/21. Findings indicated a focally positive margin over a portion of bone.  Subsequently, the patient was referred to Dr. Erin Hearing (plastic surgery) on 12/03/21 to discuss removal of the focally positive margin as well as burring and reconstruction. She will have that performed later today. Following evaluation, Dr. Erin Hearing recommended radiation given the possibly positive margin, in addition to burring of the positive margin.   Per my personal correspondence with Dr Winifred Olive, she has a recurrent infiltrative bcc that was treated  around 2016, the scar was then revised with a rotation flap and now the patient has a recurrence.  There was PNI and involvement of periosteum and he did not get clear margins because he cannot burr bone in his office. He thinks the rotation flap that was done for scar revision created a discontinuous tumor so Mohs may not give her the high cure rate that it is quoted.  Attached are photos of the positive area from Dr. Winifred Olive.  History of Blistering sunburns, if any: Yes, significant history of sun exposure   SAFETY ISSUES: Prior radiation? No Pacemaker/ICD? No Possible current pregnancy? No--hysterectomy Is the patient on methotrexate? No  Current Complaints / other details:  current smoker. Retiring soon from [employer] work. Here with friend today who is a surgical nurse.     PREVIOUS RADIATION THERAPY: No  PAST MEDICAL HISTORY:  has a past medical history of Hyperlipidemia, Hypertension, and Skin cancer.    PAST SURGICAL HISTORY: Past Surgical History:  Procedure Laterality Date   APPENDECTOMY     CESAREAN SECTION     x 2   CHOLECYSTECTOMY     DG GALL BLADDER     LAPAROSCOPIC HYSTERECTOMY     SKIN FULL THICKNESS GRAFT N/A 12/05/2021   Procedure: local tissue rearrangment, mucosal closure and burring of mandible bone;  Surgeon: [name], MD;  Location: [facility];  Service: Plastics;  Laterality: N/A;    FAMILY HISTORY: family history includes Heart failure in her father; Hyperlipidemia in her mother; Hypertension in her brother,  mother, and sister; Lung cancer in her father and sister.  SOCIAL HISTORY:  reports that she has been smoking cigarettes. She has been smoking an average of .5 packs per day. She has never used smokeless tobacco. She reports that she does not currently use alcohol. She reports that she does not currently use drugs.  ALLERGIES: Patient has no known allergies.  MEDICATIONS:  Current Outpatient Medications  Medication Sig Dispense Refill    acetaminophen (TYLENOL) 500 MG tablet Take 500 mg by mouth every 6 (six) hours as needed (pain.).     atorvastatin (LIPITOR) 10 MG tablet Take 10 mg by mouth every evening.     busPIRone (BUSPAR) 10 MG tablet Take 10 mg by mouth at bedtime.     Calcium Carb-Cholecalciferol (CALCIUM 600+D3 PO) Take 1 tablet by mouth in the morning.     COLLAGEN-VITAMIN C PO Take 2 tablets by mouth in the morning.     Cyanocobalamin (VITAMIN B-12) 2500 MCG SUBL Take 2,500 mcg by mouth in the morning.     estrogens, conjugated, (PREMARIN) 0.625 MG tablet Take 0.3125 mg by mouth every evening.     ferrous sulfate 325 (65 FE) MG EC tablet Take 325 mg by mouth every other day.     gabapentin (NEURONTIN) 300 MG capsule Take 300 mg by mouth at bedtime as needed (restless leg syndrome).     ibuprofen (ADVIL) 200 MG tablet Take 400 mg by mouth every 8 (eight) hours as needed (pain.).     losartan (COZAAR) 25 MG tablet Take 12.5 mg by mouth every evening.     Magnesium 250 MG TABS Take 250 mg by mouth in the morning.     Omega-3 Fatty Acids (FISH OIL) 1000 MG CAPS Take 1,000 mg by mouth in the morning.     ondansetron (ZOFRAN-ODT) 4 MG disintegrating tablet Take 1 tablet (4 mg total) by mouth every 8 (eight) hours as needed for nausea or vomiting. 20 tablet 0   oxyCODONE (ROXICODONE) 5 MG immediate release tablet Take 1 tablet (5 mg total) by mouth every 4 (four) hours as needed for up to 5 days for severe pain. 20 tablet 0   sertraline (ZOLOFT) 50 MG tablet Take 75 mg by mouth every evening.     SUMAtriptan (IMITREX) 100 MG tablet Take 100 mg by mouth every 2 (two) hours as needed for migraine (max 2 doses/24hrs.). May repeat in 2 hours if headache persists or recurs.     traMADol (ULTRAM) 50 MG tablet Take 50 mg by mouth daily as needed (migraine headaches.).     triamcinolone (NASACORT) 55 MCG/ACT AERO nasal inhaler Place 1 spray into the nose daily.     No current facility-administered medications for this encounter.     REVIEW OF SYSTEMS:  Notable for that above.   PHYSICAL EXAM:  height is 5\' 5"  (1.651 m) and weight is 122 lb 4 oz (55.5 kg). Her temporal temperature is 97.4 F (36.3 C) (abnormal). Her blood pressure is 101/74 and her pulse is 90. Her respiration is 18 and oxygen saturation is 98%.   General: Alert and oriented, in mild acute distress HEENT: see photos below. Packing in mohs site, chin, with apparent tumor involvement in anterior mandible. Neck: Neck is supple, no palpable cervical or supraclavicular lymphadenopathy. + submental edema Heart: Regular in rate and rhythm with no murmurs, rubs, or gallops. Chest: Clear to auscultation bilaterally, with no rhonchi, wheezes, or rales. Abdomen: Soft, nontender, nondistended, with no rigidity or guarding.  Extremities: No cyanosis or edema. Lymphatics: see Neck Exam Skin: see HEENT Musculoskeletal: symmetric strength and muscle tone throughout. Neurologic: No obvious focalities. Speech is fluent. Coordination is intact. Psychiatric:  Affect is appropriate.      ECOG = 1  0 - Asymptomatic (Fully active, able to carry on all predisease activities without restriction)  1 - Symptomatic but completely ambulatory (Restricted in physically strenuous activity but ambulatory and able to carry out work of a light or sedentary nature. For example, light housework, office work)  2 - Symptomatic, <50% in bed during the day (Ambulatory and capable of all self care but unable to carry out any work activities. Up and about more than 50% of waking hours)  3 - Symptomatic, >50% in bed, but not bedbound (Capable of only limited self-care, confined to bed or chair 50% or more of waking hours)  4 - Bedbound (Completely disabled. Cannot carry on any self-care. Totally confined to bed or chair)  5 - Death   Eustace Pen MM, Creech RH, Tormey DC, et al. [phone]). "Toxicity and response criteria of the [facility]". [place] Oncol. 5 (6):  649-55   LABORATORY DATA:  Lab Results  Component Value Date   WBC 10.0 12/05/2021   HGB 13.0 12/05/2021   HCT 40.4 12/05/2021   MCV 88.4 12/05/2021   PLT 398 12/05/2021   CMP     Component Value Date/Time   NA 133 (L) 12/05/2021 1313   K 3.7 12/05/2021 1313   CL 92 (L) 12/05/2021 1313   CO2 26 12/05/2021 1313   GLUCOSE 94 12/05/2021 1313   BUN 7 (L) 12/05/2021 1313   CREATININE 0.78 12/05/2021 1313   CALCIUM 9.4 12/05/2021 1313   GFRNONAA >60 12/05/2021 1313        RADIOGRAPHY: No results found.    IMPRESSION/PLAN: Locally advanced basal cell carcinoma, chin, bone involvement  Today, I talked to the patient about the findings and work-up thus far.  We discussed the patient's diagnosis of skin cancer with bone involvement and general treatment for this, highlighting the role of radiotherapy in the management.  We discussed the available radiation techniques, and focused on the details of logistics and delivery.   Reconstructive surgery and bone burring is pending for today. I am not sure how many stages this process will take  We discussed the risks, benefits, and side effects of adjuvant radiotherapy. Side effects may include but not necessarily be limited to: skin irritation, lymphedema, fatigue, taste changes, oral mucositis, bone injury, dental injury, osteoradionecrosis, nonhealing wound in treatment site.  No guarantees of treatment were given. A consent form was signed and placed in the patient's medical record. The patient was encouraged to ask questions that I answered to the best of my ability.  Anticipate 4-6 weeks of treatment.  Referral to Dr. Benson Norway for pre-op assessment (dental)  Consider PT in future due to lymphedema of upper neck.  Emotional support given. She may benefit from counseling in the future  I asked the patient today about tobacco use. The patient uses tobacco.  I advised the patient to quit. Services were offered by me today including outpatient  counseling and pharmacotherapy. I assessed for the willingness to attempt to quit and provided encouragement and demonstrated willingness to make referrals and/or prescriptions to help the patient attempt to quit. The patient has follow-up with the oncologic team to touch base on their tobacco use and /or cessation efforts.  At this time she does not  seen mentally ready to quit. She is aware of how smoking can inhibit healing from surgery and RT.  I sent a message to Drs. Mitkov, Owsley, and Luppens to coordinate care, and [employer], our H+N navigator, will be involved.  On date of service, in total, I spent 60 minutes on this encounter. Patient was seen in person.   __________________________________________   Eppie Gibson, MD  This document serves as a record of services personally performed by Eppie Gibson, MD. It was created on her behalf by Roney Mans, a trained medical scribe. The creation of this record is based on the scribe's personal observations and the provider's statements to them. This document has been checked and approved by the attending provider.

## 2021-12-05 ENCOUNTER — Other Ambulatory Visit: Payer: Self-pay

## 2021-12-05 ENCOUNTER — Encounter: Payer: Self-pay | Admitting: Radiation Oncology

## 2021-12-05 ENCOUNTER — Ambulatory Visit (HOSPITAL_COMMUNITY): Payer: BC Managed Care – PPO | Admitting: Anesthesiology

## 2021-12-05 ENCOUNTER — Ambulatory Visit
Admission: RE | Admit: 2021-12-05 | Discharge: 2021-12-05 | Disposition: A | Discharge status: Home / Self Care | Payer: BC Managed Care – PPO | Source: Ambulatory Visit | Attending: Radiation Oncology | Admitting: Radiation Oncology

## 2021-12-05 ENCOUNTER — Encounter (HOSPITAL_COMMUNITY): Payer: Self-pay | Admitting: Plastic Surgery

## 2021-12-05 ENCOUNTER — Ambulatory Visit (HOSPITAL_COMMUNITY)
Admission: RE | Admit: 2021-12-05 | Discharge: 2021-12-05 | Disposition: A | Discharge status: Home / Self Care | Payer: BC Managed Care – PPO | Attending: Plastic Surgery | Admitting: Plastic Surgery

## 2021-12-05 ENCOUNTER — Encounter (HOSPITAL_COMMUNITY)
Admission: RE | Disposition: A | Discharge status: Home / Self Care | Payer: Self-pay | Source: Home / Self Care | Attending: Plastic Surgery

## 2021-12-05 ENCOUNTER — Institutional Professional Consult (permissible substitution): Payer: BC Managed Care – PPO | Admitting: Plastic Surgery

## 2021-12-05 VITALS — BP 101/74 | HR 90 | Temp 97.4°F | Resp 18 | Ht 65.0 in | Wt 122.2 lb

## 2021-12-05 DIAGNOSIS — Z79899 Other long term (current) drug therapy: Secondary | ICD-10-CM | POA: Insufficient documentation

## 2021-12-05 DIAGNOSIS — C44319 Basal cell carcinoma of skin of other parts of face: Secondary | ICD-10-CM

## 2021-12-05 DIAGNOSIS — E785 Hyperlipidemia, unspecified: Secondary | ICD-10-CM | POA: Insufficient documentation

## 2021-12-05 DIAGNOSIS — F17210 Nicotine dependence, cigarettes, uncomplicated: Secondary | ICD-10-CM | POA: Diagnosis not present

## 2021-12-05 DIAGNOSIS — Z801 Family history of malignant neoplasm of trachea, bronchus and lung: Secondary | ICD-10-CM | POA: Insufficient documentation

## 2021-12-05 DIAGNOSIS — I10 Essential (primary) hypertension: Secondary | ICD-10-CM | POA: Insufficient documentation

## 2021-12-05 DIAGNOSIS — M952 Other acquired deformity of head: Secondary | ICD-10-CM | POA: Diagnosis not present

## 2021-12-05 HISTORY — PX: SKIN FULL THICKNESS GRAFT: SHX442

## 2021-12-05 HISTORY — DX: Unspecified malignant neoplasm of skin, unspecified: C44.90

## 2021-12-05 LAB — CBC
HCT: 40.4 % (ref 36.0–46.0)
Hemoglobin: 13 g/dL (ref 12.0–15.0)
MCH: 28.4 pg (ref 26.0–34.0)
MCHC: 32.2 g/dL (ref 30.0–36.0)
MCV: 88.4 fL (ref 80.0–100.0)
Platelets: 398 10*3/uL (ref 150–400)
RBC: 4.57 MIL/uL (ref 3.87–5.11)
RDW: 12.7 % (ref 11.5–15.5)
WBC: 10 10*3/uL (ref 4.0–10.5)
nRBC: 0 % (ref 0.0–0.2)

## 2021-12-05 LAB — BASIC METABOLIC PANEL
Anion gap: 15 (ref 5–15)
BUN: 7 mg/dL — ABNORMAL LOW (ref 8–23)
CO2: 26 mmol/L (ref 22–32)
Calcium: 9.4 mg/dL (ref 8.9–10.3)
Chloride: 92 mmol/L — ABNORMAL LOW (ref 98–111)
Creatinine, Ser: 0.78 mg/dL (ref 0.44–1.00)
GFR, Estimated: >60 mL/min (ref >60)
Glucose, Bld: 94 mg/dL (ref 70–99)
Potassium: 3.7 mmol/L (ref 3.5–5.1)
Sodium: 133 mmol/L — ABNORMAL LOW (ref 135–145)

## 2021-12-05 SURGERY — APPLICATION, GRAFT, SKIN, FULL-THICKNESS
Anesthesia: General

## 2021-12-05 MED ORDER — BACITRACIN ZINC 500 UNIT/GM EX OINT
TOPICAL_OINTMENT | CUTANEOUS | Status: AC
  Filled 2021-12-05: qty 28.35

## 2021-12-05 MED ORDER — ACETAMINOPHEN 500 MG PO TABS: 1000.0000 mg | ORAL_TABLET | Freq: Once | ORAL | Status: DC

## 2021-12-05 MED ORDER — CHLORHEXIDINE GLUCONATE 0.12 % MT SOLN
OROMUCOSAL | Status: AC
  Administered 2021-12-05: 15 mL via OROMUCOSAL
  Filled 2021-12-05: qty 15

## 2021-12-05 MED ORDER — FENTANYL CITRATE (PF) 100 MCG/2ML IJ SOLN
25.0000 ug | INTRAMUSCULAR | Status: DC | PRN
  Administered 2021-12-05: 50 ug via INTRAVENOUS

## 2021-12-05 MED ORDER — LACTATED RINGERS IV SOLN: INTRAVENOUS | Status: DC

## 2021-12-05 MED ORDER — LACTATED RINGERS IV SOLN
INTRAVENOUS | Status: DC | PRN
Start: 2021-12-05 — End: 2021-12-05

## 2021-12-05 MED ORDER — PROPOFOL 10 MG/ML IV BOLUS
INTRAVENOUS | Status: AC
  Filled 2021-12-05: qty 20

## 2021-12-05 MED ORDER — ROCURONIUM BROMIDE 10 MG/ML (PF) SYRINGE
PREFILLED_SYRINGE | INTRAVENOUS | Status: DC | PRN
  Administered 2021-12-05: 10 mg via INTRAVENOUS
  Administered 2021-12-05: 40 mg via INTRAVENOUS

## 2021-12-05 MED ORDER — ACETAMINOPHEN 10 MG/ML IV SOLN
INTRAVENOUS | Status: DC | PRN
  Administered 2021-12-05: 1000 mg via INTRAVENOUS

## 2021-12-05 MED ORDER — ACETAMINOPHEN 10 MG/ML IV SOLN
INTRAVENOUS | Status: AC
  Filled 2021-12-05: qty 100

## 2021-12-05 MED ORDER — BUPIVACAINE-EPINEPHRINE (PF) 0.25% -1:200000 IJ SOLN
INTRAMUSCULAR | Status: DC | PRN
  Administered 2021-12-05: 1 mL via PERINEURAL
  Administered 2021-12-05: 20 mL via PERINEURAL

## 2021-12-05 MED ORDER — FENTANYL CITRATE (PF) 100 MCG/2ML IJ SOLN
INTRAMUSCULAR | Status: AC
  Filled 2021-12-05: qty 2

## 2021-12-05 MED ORDER — ONDANSETRON 4 MG PO TBDP: 4.0000 mg | ORAL_TABLET | Freq: Three times a day (TID) | ORAL | 0 refills | Status: DC | PRN

## 2021-12-05 MED ORDER — PHENYLEPHRINE 40 MCG/ML (10ML) SYRINGE FOR IV PUSH (FOR BLOOD PRESSURE SUPPORT)
PREFILLED_SYRINGE | INTRAVENOUS | Status: DC | PRN
  Administered 2021-12-05: 80 ug via INTRAVENOUS

## 2021-12-05 MED ORDER — DEXAMETHASONE SODIUM PHOSPHATE 10 MG/ML IJ SOLN
INTRAMUSCULAR | Status: DC | PRN
Start: 2021-12-05 — End: 2021-12-05
  Administered 2021-12-05: 10 mg via INTRAVENOUS

## 2021-12-05 MED ORDER — ORAL CARE MOUTH RINSE: 15.0000 mL | Freq: Once | OROMUCOSAL | Status: AC

## 2021-12-05 MED ORDER — CEFAZOLIN SODIUM-DEXTROSE 2-3 GM-%(50ML) IV SOLR
INTRAVENOUS | Status: DC | PRN
  Administered 2021-12-05: 2 g via INTRAVENOUS

## 2021-12-05 MED ORDER — MIDAZOLAM HCL 2 MG/2ML IJ SOLN
INTRAMUSCULAR | Status: DC | PRN
  Administered 2021-12-05: 2 mg via INTRAVENOUS

## 2021-12-05 MED ORDER — MIDAZOLAM HCL 2 MG/2ML IJ SOLN
INTRAMUSCULAR | Status: AC
  Filled 2021-12-05: qty 2

## 2021-12-05 MED ORDER — FENTANYL CITRATE (PF) 250 MCG/5ML IJ SOLN
INTRAMUSCULAR | Status: AC
  Filled 2021-12-05: qty 5

## 2021-12-05 MED ORDER — BUPIVACAINE-EPINEPHRINE (PF) 0.25% -1:200000 IJ SOLN
INTRAMUSCULAR | Status: AC
  Filled 2021-12-05: qty 30

## 2021-12-05 MED ORDER — SODIUM CHLORIDE 0.9 % IR SOLN
Status: DC | PRN
  Administered 2021-12-05: 1000 mL

## 2021-12-05 MED ORDER — LIDOCAINE 2% (20 MG/ML) 5 ML SYRINGE
INTRAMUSCULAR | Status: DC | PRN
Start: 2021-12-05 — End: 2021-12-05
  Administered 2021-12-05: 60 mg via INTRAVENOUS

## 2021-12-05 MED ORDER — SUGAMMADEX SODIUM 200 MG/2ML IV SOLN
INTRAVENOUS | Status: DC | PRN
Start: 2021-12-05 — End: 2021-12-05
  Administered 2021-12-05: 200 mg via INTRAVENOUS

## 2021-12-05 MED ORDER — EPINEPHRINE PF 1 MG/ML IJ SOLN
INTRAMUSCULAR | Status: AC
  Filled 2021-12-05: qty 1

## 2021-12-05 MED ORDER — FENTANYL CITRATE (PF) 250 MCG/5ML IJ SOLN
INTRAMUSCULAR | Status: DC | PRN
  Administered 2021-12-05: 150 ug via INTRAVENOUS
  Administered 2021-12-05: 100 ug via INTRAVENOUS

## 2021-12-05 MED ORDER — CEFAZOLIN SODIUM-DEXTROSE 2-4 GM/100ML-% IV SOLN
INTRAVENOUS | Status: AC
  Filled 2021-12-05: qty 100

## 2021-12-05 MED ORDER — CHLORHEXIDINE GLUCONATE 0.12 % MT SOLN: 15.0000 mL | Freq: Once | OROMUCOSAL | Status: AC

## 2021-12-05 MED ORDER — PROPOFOL 10 MG/ML IV BOLUS
INTRAVENOUS | Status: DC | PRN
Start: 2021-12-05 — End: 2021-12-05
  Administered 2021-12-05: 40 mg via INTRAVENOUS
  Administered 2021-12-05: 110 mg via INTRAVENOUS

## 2021-12-05 MED ORDER — PROMETHAZINE HCL 25 MG/ML IJ SOLN: 6.2500 mg | INTRAMUSCULAR | Status: DC | PRN

## 2021-12-05 MED ORDER — OXYMETAZOLINE HCL 0.05 % NA SOLN
NASAL | Status: AC
  Filled 2021-12-05: qty 30

## 2021-12-05 MED ORDER — BUPIVACAINE HCL (PF) 0.25 % IJ SOLN
INTRAMUSCULAR | Status: AC
  Filled 2021-12-05: qty 30

## 2021-12-05 MED ORDER — OXYCODONE HCL 5 MG PO TABS
5.0000 mg | ORAL_TABLET | ORAL | 0 refills | Status: AC | PRN
Start: 2021-12-05 — End: 2021-12-10

## 2021-12-05 SURGICAL SUPPLY — 66 items
BALL CTTN LRG ABS STRL LF (GAUZE/BANDAGES/DRESSINGS)
BLADE CLIPPER SURG (BLADE) IMPLANT
BLADE DERMATOME SS (BLADE) IMPLANT
BNDG ELASTIC 4X5.8 VLCR STR LF (GAUZE/BANDAGES/DRESSINGS) IMPLANT
BNDG ELASTIC 6X5.8 VLCR STR LF (GAUZE/BANDAGES/DRESSINGS) IMPLANT
BNDG GAUZE ELAST 4 BULKY (GAUZE/BANDAGES/DRESSINGS) IMPLANT
BRUSH SCRUB EZ PLAIN DRY (MISCELLANEOUS) IMPLANT
BUR 3.5 LG SPHERICAL (BURR) ×1 IMPLANT
BURR 3.5 LG SPHERICAL (BURR) ×3
CANISTER SUCT 3000ML PPV (MISCELLANEOUS) IMPLANT
CANISTER WOUND CARE 500ML ATS (WOUND CARE) IMPLANT
COTTONBALL LRG STERILE PKG (GAUZE/BANDAGES/DRESSINGS) ×1 IMPLANT
COVER SURGICAL LIGHT HANDLE (MISCELLANEOUS) ×3 IMPLANT
DERMACARRIERS GRAFT 1 TO 1.5 (DISPOSABLE)
DRAPE EXTREMITY T 121X128X90 (DISPOSABLE) IMPLANT
DRAPE HALF SHEET 40X57 (DRAPES) IMPLANT
DRAPE INCISE IOBAN 66X45 STRL (DRAPES) IMPLANT
DRAPE ORTHO SPLIT 77X108 STRL (DRAPES) ×3
DRAPE SURG ORHT 6 SPLT 77X108 (DRAPES) ×1 IMPLANT
DRESSING MEPILEX FLEX 4X4 (GAUZE/BANDAGES/DRESSINGS) ×1 IMPLANT
DRSG CALCIUM ALGINATE 4X4 (GAUZE/BANDAGES/DRESSINGS) IMPLANT
DRSG MEPILEX FLEX 4X4 (GAUZE/BANDAGES/DRESSINGS) ×3
DRSG MEPITEL 4X7.2 (GAUZE/BANDAGES/DRESSINGS) IMPLANT
DRSG OPSITE 6X11 MED (GAUZE/BANDAGES/DRESSINGS) IMPLANT
DRSG PAD ABDOMINAL 8X10 ST (GAUZE/BANDAGES/DRESSINGS) IMPLANT
DRSG VAC ATS LRG SENSATRAC (GAUZE/BANDAGES/DRESSINGS) IMPLANT
DRSG VAC ATS MED SENSATRAC (GAUZE/BANDAGES/DRESSINGS) IMPLANT
DRSG VAC ATS SM SENSATRAC (GAUZE/BANDAGES/DRESSINGS) IMPLANT
ELECT REM PT RETURN 9FT ADLT (ELECTROSURGICAL) ×3
ELECTRODE REM PT RTRN 9FT ADLT (ELECTROSURGICAL) ×2 IMPLANT
GAUZE 4X4 16PLY ~~LOC~~+RFID DBL (SPONGE) ×2 IMPLANT
GAUZE SPONGE 4X4 12PLY STRL (GAUZE/BANDAGES/DRESSINGS) ×2 IMPLANT
GAUZE XEROFORM 5X9 LF (GAUZE/BANDAGES/DRESSINGS) IMPLANT
GLOVE SURG ENC TEXT LTX SZ7.5 (GLOVE) ×6 IMPLANT
GLOVE SURG UNDER POLY LF SZ7.5 (GLOVE) ×3 IMPLANT
GOWN STRL REUS W/ TWL LRG LVL3 (GOWN DISPOSABLE) ×6 IMPLANT
GOWN STRL REUS W/TWL LRG LVL3 (GOWN DISPOSABLE) ×12
GOWN STRL REUS W/TWL XL LVL3 (GOWN DISPOSABLE) ×1 IMPLANT
GRAFT DERMACARRIERS 1 TO 1.5 (DISPOSABLE) IMPLANT
HANDPIECE INTERPULSE COAX TIP (DISPOSABLE)
KIT BASIN OR (CUSTOM PROCEDURE TRAY) ×3 IMPLANT
KIT TURNOVER KIT B (KITS) ×3 IMPLANT
MANIFOLD NEPTUNE II (INSTRUMENTS) IMPLANT
NS IRRIG 1000ML POUR BTL (IV SOLUTION) ×1 IMPLANT
PACK GENERAL/GYN (CUSTOM PROCEDURE TRAY) ×3 IMPLANT
PAD ARMBOARD 7.5X6 YLW CONV (MISCELLANEOUS) ×6 IMPLANT
RETRACTOR ONETRAX LX 90X20 (MISCELLANEOUS) ×2 IMPLANT
SET HNDPC FAN SPRY TIP SCT (DISPOSABLE) IMPLANT
SPONGE T-LAP 18X18 ~~LOC~~+RFID (SPONGE) ×2 IMPLANT
STAPLER VISISTAT 35W (STAPLE) IMPLANT
SUT CHROMIC 4 0 PS 2 18 (SUTURE) IMPLANT
SUT CHROMIC 5 0 PS 3 (SUTURE) ×1 IMPLANT
SUT MNCRL 3 0 VIOLET RB1 (SUTURE) ×1 IMPLANT
SUT MON AB 5-0 P3 18 (SUTURE) ×2 IMPLANT
SUT MONOCRYL 3 0 RB1 (SUTURE) ×3
SUT PDS AB 3-0 SH 27 (SUTURE) IMPLANT
SUT PDS AB 4-0 P3 18 (SUTURE) ×2 IMPLANT
SUT PROLENE 5 0 PS 2 (SUTURE) ×4 IMPLANT
SUT SILK 3 0 SH CR/8 (SUTURE) ×1 IMPLANT
SUT VIC AB 2-0 SH 18 (SUTURE) ×2 IMPLANT
SUT VIC AB 2-0 SH 27 (SUTURE) ×3
SUT VIC AB 2-0 SH 27X BRD (SUTURE) ×1 IMPLANT
SYR CONTROL 10ML LL (SYRINGE) ×3 IMPLANT
TOWEL GREEN STERILE (TOWEL DISPOSABLE) ×3 IMPLANT
TOWEL GREEN STERILE FF (TOWEL DISPOSABLE) ×3 IMPLANT
UNDERPAD 30X36 HEAVY ABSORB (UNDERPADS AND DIAPERS) ×1 IMPLANT

## 2021-12-05 NOTE — Interval H&P Note (Signed)
History and Physical Interval Note:  12/05/2021 3:12 PM  Tonya Delgado  has presented today for surgery, with the diagnosis of Basal Cell Carcinoma of chin.  The various methods of treatment have been discussed with the patient and family. After consideration of risks, benefits and other options for treatment, the patient has consented to  Procedure(s): SKIN GRAFT SPLIT THICKNESS (N/A) SKIN GRAFT FULL THICKNESS (N/A) ADJACENT TISSUE TRANSFER/TISSUE REARRANGEMENT (N/A) APPLICATION OF SKIN SUBSTITUTE (N/A) as a surgical intervention.  The patient's history has been reviewed, patient examined, no change in status, stable for surgery.  I have reviewed the patient's chart and labs.  Questions were answered to the patient's satisfaction.     [name]

## 2021-12-05 NOTE — Transfer of Care (Signed)
Immediate Anesthesia Transfer of Care Note  Patient: Tonya Delgado  Procedure(s) Performed: local tissue rearrangment, mucosal closure and burring of mandible bone  Patient Location: PACU  Anesthesia Type:General  Level of Consciousness: awake, alert  and oriented  Airway & Oxygen Therapy: Patient Spontanous Breathing  Post-op Assessment: Report given to RN and Post -op Vital signs reviewed and stable  Post vital signs: Reviewed and stable  Last Vitals:  Vitals Value Taken Time  BP 156/72 12/05/21 1736  Temp    Pulse 93 12/05/21 1741  Resp 13 12/05/21 1741  SpO2 90 % 12/05/21 1741  Vitals shown include unvalidated device data.  Last Pain:  Vitals:   12/05/21 1314  TempSrc:   PainSc: 5          Complications: No notable events documented.

## 2021-12-05 NOTE — Op Note (Signed)
Operative Note   DATE OF OPERATION: 12/05/2021  SURGICAL DEPARTMENT: Plastic Surgery  PREOPERATIVE DIAGNOSES: Chin basal cell carcinoma  POSTOPERATIVE DIAGNOSES:  same  PROCEDURE:   1) 3 x 4 cm local tissue rearrangement central chin, including the size of the initial defect 2) burring of outer table of mandible bone 3) 2.5 cm mucosal closure, intraoral and lower buccal sulcus  SURGEON: Emran Molzahn P. Dennys Guin, MD  ASSISTANT: None  ANESTHESIA:  General.   COMPLICATIONS: None.   INDICATIONS FOR PROCEDURE:  The patient, Tonya Delgado is a 66 y.o. female born on 08-11-1956, is here for treatment of chin basal cell carcinoma after Mohs defect.  Due to concerns of margin in the area of exposed bone the outer table of bone was burred with instructions from Mohs surgeon. MRN: [id number]  CONSENT:  Informed consent was obtained directly from the patient. Risks, benefits and alternatives were fully discussed. Specific risks including but not limited to bleeding, infection, hematoma, seroma, scarring, pain, contracture, asymmetry, wound healing problems, and need for further surgery were all discussed. The patient did have an ample opportunity to have questions answered to satisfaction.   DESCRIPTION OF PROCEDURE:  The patient was taken to the operating room. SCDs were placed and preoperative antibiotics were given.  General nasotracheal anesthesia was administered.  The patient's operative site was prepped and draped in a sterile fashion. A time out was performed and all information was confirmed to be correct.    Patient taken the operating room and the patient was nasally intubated.  This was done so that I could observe the relationship of the lips during closure and decide on the best manner of closure.  I first turned my attention to the area of exposed bone.  With instruction from the Mohs surgeon and photographic diagrams I burred the outer table of the mandible bone in the area of concern.   Basically the entire outer table of the exposed bone was burred with a pineapple bur.  Attention was then turned toward the mucosal closure.  A 2.5 area of mucosal defect was noted in the lower buccal sulcus.  I used 2-0 Vicryl to close this with interrupted horizontal mattress sutures.  There was limited tissue on the mandibular side of the mucosa after tumor resection.  I was able to achieve a satisfactory closure though.  Following this I turned my attention toward closure of the chin soft tissue defect.  Initially I widely undermined the skin and also undermined the soft tissue along a bony plane inferiorly.  I tried closing the wound in a horizontal fashion and although the lip was not pulled down significantly the soft tissue height of the chin was shortened and unsatisfactory cosmetically.  I decided to try closing in a vertical direction.  Better cosmesis was able to be achieved.  I performed local tissue rearrangement by incising the patient's previous scar along the lip which reduced the pucker under the lip.  Inferiorly I also made a backcut along the inferior portion of the incision so that the dogear did not create bunching at the chin neck junction.  After closing deep tissues with 2-0 Vicryl I closed the dermis with 4-0 PDS.  Skin was closed with a combination of horizontal mattress and simple interrupted sutures.  The patient tolerated the procedure well.  There were no complications. The patient was allowed to wake from anesthesia, extubated and taken to the recovery room in satisfactory condition.

## 2021-12-05 NOTE — Anesthesia Preprocedure Evaluation (Signed)
Anesthesia Evaluation  Patient identified by MRN, date of birth, ID band Patient awake  General Assessment Comment:Basal Cell Carcinoma of chin  Reviewed: Allergy & Precautions, NPO status , Patient's Chart, lab work & pertinent test results  Airway Mallampati: IV  TM Distance: >3 FB Neck ROM: Full  Mouth opening: Limited Mouth Opening  Dental  (+) Teeth Intact, Dental Advisory Given   Pulmonary Current SmokerPatient did not abstain from smoking.,    Pulmonary exam normal breath sounds clear to auscultation       Cardiovascular hypertension, Pt. on medications Normal cardiovascular exam Rhythm:Regular Rate:Normal     Neuro/Psych negative neurological ROS  negative psych ROS   GI/Hepatic negative GI ROS, Neg liver ROS,   Endo/Other  negative endocrine ROS  Renal/GU negative Renal ROS     Musculoskeletal negative musculoskeletal ROS (+)   Abdominal   Peds  Hematology negative hematology ROS (+)   Anesthesia Other Findings Day of surgery medications reviewed with the patient.  Reproductive/Obstetrics                             Anesthesia Physical Anesthesia Plan  ASA: 2  Anesthesia Plan: General   Post-op Pain Management:    Induction: Intravenous  PONV Risk Score and Plan: 2 and Midazolam, Dexamethasone and Ondansetron  Airway Management Planned: Oral ETT and Video Laryngoscope Planned  Additional Equipment:   Intra-op Plan:   Post-operative Plan: Extubation in OR  Informed Consent: I have reviewed the patients History and Physical, chart, labs and discussed the procedure including the risks, benefits and alternatives for the proposed anesthesia with the patient or authorized representative who has indicated his/her understanding and acceptance.     Dental advisory given  Plan Discussed with: CRNA  Anesthesia Plan Comments:         Anesthesia Quick Evaluation

## 2021-12-05 NOTE — Anesthesia Procedure Notes (Signed)
Procedure Name: Intubation Date/Time: 12/05/2021 3:49 PM Performed by: Eligha Bridegroom, CRNA Pre-anesthesia Checklist: Patient identified, Emergency Drugs available, Suction available, Patient being monitored and Timeout performed Patient Re-evaluated:Patient Re-evaluated prior to induction Oxygen Delivery Method: Circle system utilized Preoxygenation: Pre-oxygenation with 100% oxygen Induction Type: IV induction Ventilation: Mask ventilation without difficulty Laryngoscope Size: Mac and 3 Nasal Tubes: Magill forceps- large, utilized, Right, Nasal prep performed and Nasal Rae Tube size: 7.0 mm Number of attempts: 1 Placement Confirmation: ETT inserted through vocal cords under direct vision and positive ETCO2 Tube secured with: Tape Dental Injury: Teeth and Oropharynx as per pre-operative assessment

## 2021-12-06 NOTE — Anesthesia Postprocedure Evaluation (Signed)
Anesthesia Post Note  Patient: Tonya Delgado  Procedure(s) Performed: local tissue rearrangment, mucosal closure and burring of mandible bone     Patient location during evaluation: PACU Anesthesia Type: General Level of consciousness: awake and alert Pain management: pain level controlled Vital Signs Assessment: post-procedure vital signs reviewed and stable Respiratory status: spontaneous breathing, nonlabored ventilation, respiratory function stable and patient connected to nasal cannula oxygen Cardiovascular status: blood pressure returned to baseline and stable Postop Assessment: no apparent nausea or vomiting Anesthetic complications: no   No notable events documented.  Last Vitals:  Vitals:   12/05/21 1806 12/05/21 1821  BP: (!) 148/77 (!) 145/79  Pulse: 90 87  Resp: (!) 21 15  Temp:  (!) 36.1 C  SpO2: 100% 100%    Last Pain:  Vitals:   12/05/21 1821  TempSrc:   PainSc: 0-No pain                 Santa Lighter

## 2021-12-07 ENCOUNTER — Encounter (HOSPITAL_COMMUNITY): Payer: Self-pay | Admitting: Plastic Surgery

## 2021-12-08 ENCOUNTER — Encounter: Payer: Self-pay | Admitting: Radiation Oncology

## 2021-12-08 DIAGNOSIS — C44319 Basal cell carcinoma of skin of other parts of face: Secondary | ICD-10-CM | POA: Insufficient documentation

## 2021-12-08 NOTE — Progress Notes (Signed)
Dental Form with Estimates of Radiation Dose      Diagnosis:    ICD-10-CM   1. Basal cell carcinoma (BCC) of chin  C44.319     2. Basal cell carcinoma of chin  C44.319       Prognosis: curative  Anticipated # of fractions: 30    Daily?: yes  # of weeks of radiotherapy: 6  Chemotherapy?: no  Anticipated xerostomia:  Transient   Pre-simulation needs:    Please see in 2-3 wks (getting plastic surgery now, with bone burring)   Need your help to minimize risk of osteoradionecrosis of the jaw.   I took some photos for you in my consult note.  I think the 50-60Gy isodose line may skim the very bottoms of her anterior mandibular roots, but not treat them entirely.  Let me know your thoughts.    Simulation: in approx 5 weeks depending on healing  Other Notes:  Please contact Eppie Gibson, MD, with patient's disposition after evaluation and/or dental treatment.

## 2021-12-09 ENCOUNTER — Telehealth: Payer: Self-pay

## 2021-12-09 LAB — SURGICAL PATHOLOGY

## 2021-12-09 NOTE — Telephone Encounter (Signed)
Patient called with post op concerns. She notes her bandage is getting soaked with what she thinks is saliva and she is having to change the bandage 4-5 times a day. She would like to know if this is normal and if she may need antibiotics.

## 2021-12-10 NOTE — Telephone Encounter (Signed)
Called patient back yesterday. She had guest over the night before and talked more than she should have after surgery making her mouth painful along with having some bloody clear oozing out from the horizontal incision of the chin. Today she is feeling better, but noticed the vertical incision appeared 1/4 of an inch open and puckered. Requested patient to send photo via Mychart or to Dr. Erin Hearing phone.  Patients PO is this Friday.

## 2021-12-11 ENCOUNTER — Other Ambulatory Visit: Payer: Self-pay

## 2021-12-11 ENCOUNTER — Ambulatory Visit (INDEPENDENT_AMBULATORY_CARE_PROVIDER_SITE_OTHER): Payer: BC Managed Care – PPO | Admitting: Plastic Surgery

## 2021-12-11 DIAGNOSIS — C44319 Basal cell carcinoma of skin of other parts of face: Secondary | ICD-10-CM

## 2021-12-11 NOTE — Progress Notes (Signed)
Patient noted some drainage and separation of her skin wound.  PE < 1 cm breakdown of oral mucosal closure, minimal mucosal cuff remaining on mandibular side, 1 cm skin opening superior vertical portion of wound  A/P Will plan eventual revision but I want to give her some time to start to heal the mucosa since there will not be any soft tissue over the bone in the area of separation intraorally.  Will plan to recheck the patient next week.

## 2021-12-12 ENCOUNTER — Encounter: Payer: BC Managed Care – PPO | Admitting: Plastic Surgery

## 2021-12-19 ENCOUNTER — Other Ambulatory Visit: Payer: Self-pay

## 2021-12-19 ENCOUNTER — Encounter: Payer: Self-pay | Admitting: Plastic Surgery

## 2021-12-19 ENCOUNTER — Ambulatory Visit (INDEPENDENT_AMBULATORY_CARE_PROVIDER_SITE_OTHER): Payer: BC Managed Care – PPO | Admitting: Plastic Surgery

## 2021-12-19 DIAGNOSIS — C44319 Basal cell carcinoma of skin of other parts of face: Secondary | ICD-10-CM

## 2021-12-19 NOTE — Progress Notes (Signed)
Status post closure of lip on 12/05/2021.  Patient notes still some drainage from wound this is much less now.  Physical exam Mucosal opening smaller.  Skin wound similar size.  No drainage noted in office today.  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  After this heals we will revise the skin closure.

## 2022-01-02 ENCOUNTER — Other Ambulatory Visit: Payer: Self-pay

## 2022-01-02 ENCOUNTER — Encounter: Payer: Self-pay | Admitting: Plastic Surgery

## 2022-01-02 ENCOUNTER — Ambulatory Visit (INDEPENDENT_AMBULATORY_CARE_PROVIDER_SITE_OTHER): Payer: BC Managed Care – PPO | Admitting: Plastic Surgery

## 2022-01-02 ENCOUNTER — Telehealth (HOSPITAL_COMMUNITY): Payer: Self-pay

## 2022-01-02 DIAGNOSIS — C44319 Basal cell carcinoma of skin of other parts of face: Secondary | ICD-10-CM

## 2022-01-02 NOTE — H&P (View-Only) (Signed)
? ?Referring Provider ?Marda Stalker, PA-C ?[facility] ?[place],  [place] [address]  ? ?CC:  ?Chin basal cell carcinoma ? ?Tonya Delgado is an 66 y.o. female.  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  She has an area that adhesion centrally in the scar and I would like to revise this. ? ?No Known Allergies ? ?Outpatient Encounter Medications as of 01/02/2022  ?Medication Sig  ? acetaminophen (TYLENOL) 500 MG tablet Take 500 mg by mouth every 6 (six) hours as needed (pain.).  ? atorvastatin (LIPITOR) 10 MG tablet Take 10 mg by mouth every evening.  ? busPIRone (BUSPAR) 10 MG tablet Take 10 mg by mouth at bedtime.  ? Calcium Carb-Cholecalciferol (CALCIUM 600+D3 PO) Take 1 tablet by mouth in the morning.  ? COLLAGEN-VITAMIN C PO Take 2 tablets by mouth in the morning.  ? Cyanocobalamin (VITAMIN B-12) 2500 MCG SUBL Take 2,500 mcg by mouth in the morning.  ? estrogens, conjugated, (PREMARIN) 0.625 MG tablet Take 0.3125 mg by mouth every evening.  ? ferrous sulfate 325 (65 FE) MG EC tablet Take 325 mg by mouth every other day.  ? gabapentin (NEURONTIN) 300 MG capsule Take 300 mg by mouth at bedtime as needed (restless leg syndrome).  ? ibuprofen (ADVIL) 200 MG tablet Take 400 mg by mouth every 8 (eight) hours as needed (pain.).  ? losartan (COZAAR) 25 MG tablet Take 12.5 mg by mouth every evening.  ? Magnesium 250 MG TABS Take 250 mg by mouth in the morning.  ? Omega-3 Fatty Acids (FISH OIL) 1000 MG CAPS Take 1,000 mg by mouth in the morning.  ? ondansetron (ZOFRAN-ODT) 4 MG disintegrating tablet Take 1 tablet (4 mg total) by mouth every 8 (eight) hours as needed for nausea or vomiting.  ? sertraline (ZOLOFT) 50 MG tablet Take 75 mg by mouth every evening.  ? SUMAtriptan (IMITREX) 100 MG tablet Take 100 mg by mouth every 2 (two) hours as needed for migraine (max 2 doses/24hrs.). May repeat in 2 hours if  headache persists or recurs.  ? traMADol (ULTRAM) 50 MG tablet Take 50 mg by mouth daily as needed (migraine headaches.).  ? triamcinolone (NASACORT) 55 MCG/ACT AERO nasal inhaler Place 1 spray into the nose daily.  ? ?No facility-administered encounter medications on file as of 01/02/2022.  ?  ? ?Past Medical History:  ?Diagnosis Date  ? Hyperlipidemia   ? Hypertension   ? Skin cancer   ? BCC to: left side of nose,bilateral shoulders, chest, and chin  ? ? ?Past Surgical History:  ?Procedure Laterality Date  ? APPENDECTOMY    ? CESAREAN SECTION    ? x 2  ? CHOLECYSTECTOMY    ? DG GALL BLADDER    ? LAPAROSCOPIC HYSTERECTOMY    ? SKIN FULL THICKNESS GRAFT N/A 12/05/2021  ? Procedure: local tissue rearrangment, mucosal closure and burring of mandible bone;  Surgeon: [name], MD;  Location: [facility];  Service: Plastics;  Laterality: N/A;  ? ? ?Family History  ?Problem Relation Age of Onset  ? Hyperlipidemia Mother   ? Hypertension Mother   ? Lung cancer Father   ? Heart failure Father   ? Hypertension Sister   ? Lung cancer Sister   ? Hypertension Brother   ? ? ?Social History  ? ?Social History Narrative  ? Not on file  ?  ? ?Review of Systems ?  General: Denies fevers, chills, weight loss ?CV: Denies chest pain, shortness of breath, palpitations ? ? ?Physical Exam ?Vitals with BMI 12/05/2021 12/05/2021 12/05/2021  ?Height - - -  ?Weight - - -  ?BMI - - -  ?Systolic [phone]  ?Diastolic 79 77 76  ?Pulse 87 90 90  ?  ?General:  No acute distress,  Alert and oriented, Non-Toxic, Normal speech and affect ?HEENT: No drainage from small opening central chin, some concavity central chin, small area less than 5 mm that has not reepithelialized. ? ?Assessment/Plan ?We will plan revision of chin closure with complex closure or local tissue rearrangement. ? ?[name] ?01/02/2022, 10:25 AM  ? ? ?  ?

## 2022-01-02 NOTE — Progress Notes (Signed)
? ?Referring Provider ?Marda Stalker, PA-C ?[facility] ?[place],  [place] [address]  ? ?CC:  ?Chin basal cell carcinoma ? ?Tonya Delgado is an 66 y.o. female.  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  She has an area that adhesion centrally in the scar and I would like to revise this. ? ?No Known Allergies ? ?Outpatient Encounter Medications as of 01/02/2022  ?Medication Sig  ? acetaminophen (TYLENOL) 500 MG tablet Take 500 mg by mouth every 6 (six) hours as needed (pain.).  ? atorvastatin (LIPITOR) 10 MG tablet Take 10 mg by mouth every evening.  ? busPIRone (BUSPAR) 10 MG tablet Take 10 mg by mouth at bedtime.  ? Calcium Carb-Cholecalciferol (CALCIUM 600+D3 PO) Take 1 tablet by mouth in the morning.  ? COLLAGEN-VITAMIN C PO Take 2 tablets by mouth in the morning.  ? Cyanocobalamin (VITAMIN B-12) 2500 MCG SUBL Take 2,500 mcg by mouth in the morning.  ? estrogens, conjugated, (PREMARIN) 0.625 MG tablet Take 0.3125 mg by mouth every evening.  ? ferrous sulfate 325 (65 FE) MG EC tablet Take 325 mg by mouth every other day.  ? gabapentin (NEURONTIN) 300 MG capsule Take 300 mg by mouth at bedtime as needed (restless leg syndrome).  ? ibuprofen (ADVIL) 200 MG tablet Take 400 mg by mouth every 8 (eight) hours as needed (pain.).  ? losartan (COZAAR) 25 MG tablet Take 12.5 mg by mouth every evening.  ? Magnesium 250 MG TABS Take 250 mg by mouth in the morning.  ? Omega-3 Fatty Acids (FISH OIL) 1000 MG CAPS Take 1,000 mg by mouth in the morning.  ? ondansetron (ZOFRAN-ODT) 4 MG disintegrating tablet Take 1 tablet (4 mg total) by mouth every 8 (eight) hours as needed for nausea or vomiting.  ? sertraline (ZOLOFT) 50 MG tablet Take 75 mg by mouth every evening.  ? SUMAtriptan (IMITREX) 100 MG tablet Take 100 mg by mouth every 2 (two) hours as needed for migraine (max 2 doses/24hrs.). May repeat in 2 hours if  headache persists or recurs.  ? traMADol (ULTRAM) 50 MG tablet Take 50 mg by mouth daily as needed (migraine headaches.).  ? triamcinolone (NASACORT) 55 MCG/ACT AERO nasal inhaler Place 1 spray into the nose daily.  ? ?No facility-administered encounter medications on file as of 01/02/2022.  ?  ? ?Past Medical History:  ?Diagnosis Date  ? Hyperlipidemia   ? Hypertension   ? Skin cancer   ? BCC to: left side of nose,bilateral shoulders, chest, and chin  ? ? ?Past Surgical History:  ?Procedure Laterality Date  ? APPENDECTOMY    ? CESAREAN SECTION    ? x 2  ? CHOLECYSTECTOMY    ? DG GALL BLADDER    ? LAPAROSCOPIC HYSTERECTOMY    ? SKIN FULL THICKNESS GRAFT N/A 12/05/2021  ? Procedure: local tissue rearrangment, mucosal closure and burring of mandible bone;  Surgeon: [name], MD;  Location: [facility];  Service: Plastics;  Laterality: N/A;  ? ? ?Family History  ?Problem Relation Age of Onset  ? Hyperlipidemia Mother   ? Hypertension Mother   ? Lung cancer Father   ? Heart failure Father   ? Hypertension Sister   ? Lung cancer Sister   ? Hypertension Brother   ? ? ?Social History  ? ?Social History Narrative  ? Not on file  ?  ? ?Review of Systems ?  General: Denies fevers, chills, weight loss ?CV: Denies chest pain, shortness of breath, palpitations ? ? ?Physical Exam ?Vitals with BMI 12/05/2021 12/05/2021 12/05/2021  ?Height - - -  ?Weight - - -  ?BMI - - -  ?Systolic [phone]  ?Diastolic 79 77 76  ?Pulse 87 90 90  ?  ?General:  No acute distress,  Alert and oriented, Non-Toxic, Normal speech and affect ?HEENT: No drainage from small opening central chin, some concavity central chin, small area less than 5 mm that has not reepithelialized. ? ?Assessment/Plan ?We will plan revision of chin closure with complex closure or local tissue rearrangement. ? ?[name] ?01/02/2022, 10:25 AM  ? ? ?  ?

## 2022-01-07 NOTE — Pre-Procedure Instructions (Signed)
Surgical Instructions ? ? ? Your procedure is scheduled on Tuesday 01/13/22. ? ? Report to [facility] Main Entrance "A" at 10:45 A.M., then check in with the Admitting office. ? Call this number if you have problems the morning of surgery: ? [phone] ? ? If you have any questions prior to your surgery date call [phone]: Open Monday-Friday 8am-4pm ? ? ? Remember: ? Do not eat after midnight the night before your surgery ? ?You may drink clear liquids until 09:45 the morning of your surgery.   ?Clear liquids allowed are: Water, Non-Citrus Juices (without pulp), Carbonated Beverages, Clear Tea, Black Coffee ONLY (NO MILK, CREAM OR POWDERED CREAMER of any kind), and Gatorade ?  ? Take these medicines the morning of surgery with A SIP OF WATER:  ? triamcinolone (NASACORT)  ? ? Take these medicines if needed:  ? acetaminophen (TYLENOL) ? SUMAtriptan (IMITREX)  ? traMADol Veatrice Bourbon) ? ? ?As of today, STOP taking any Aspirin (unless otherwise instructed by your surgeon) Aleve, Naproxen, Ibuprofen, Motrin, Advil, Goody's, BC's, all herbal medications, fish oil, and all vitamins. ? ?         ?Do not wear jewelry or makeup ?Do not wear lotions, powders, perfumes/colognes, or deodorant. ?Do not shave 48 hours prior to surgery.  Men may shave face and neck. ?Do not bring valuables to the hospital. ?Do not wear nail polish, gel polish, artificial nails, or any other type of covering on natural nails (fingers and toes) ?If you have artificial nails or gel coating that need to be removed by a nail salon, please have this removed prior to surgery. Artificial nails or gel coating may interfere with anesthesia's ability to adequately monitor your vital signs. ? ?[facility] is not responsible for any belongings or valuables. .  ? ?Do NOT Smoke (Tobacco/Vaping)  24 hours prior to your procedure ? ?If you use a CPAP at night, you may bring your mask for your overnight stay. ?  ?Contacts, glasses, hearing aids, dentures or partials  may not be worn into surgery, please bring cases for these belongings ?  ?For patients admitted to the hospital, discharge time will be determined by your treatment team. ?  ?Patients discharged the day of surgery will not be allowed to drive home, and someone needs to stay with them for 24 hours. ? ?NO VISITORS WILL BE ALLOWED IN PRE-OP WHERE PATIENTS ARE PREPPED FOR SURGERY.  ONLY 1 SUPPORT PERSON MAY BE PRESENT IN THE WAITING ROOM WHILE YOU ARE IN SURGERY.  IF YOU ARE TO BE ADMITTED, ONCE YOU ARE IN YOUR ROOM YOU WILL BE ALLOWED TWO (2) VISITORS. 1 (ONE) VISITOR MAY STAY OVERNIGHT BUT MUST ARRIVE TO THE ROOM BY 8pm.  Minor children may have two parents present. Special consideration for safety and communication needs will be reviewed on a case by case basis. ? ?Special instructions:   ? ?Oral Hygiene is also important to reduce your risk of infection.  Remember - BRUSH YOUR TEETH THE MORNING OF SURGERY WITH YOUR REGULAR TOOTHPASTE ? ? ?[facility]- Preparing For Surgery ? ?Before surgery, you can play an important role. Because skin is not sterile, your skin needs to be as free of germs as possible. You can reduce the number of germs on your skin by washing with CHG (chlorahexidine gluconate) Soap before surgery.  CHG is an antiseptic cleaner which kills germs and bonds with the skin to continue killing germs even after washing.   ? ? ?Please do not use  if you have an allergy to CHG or antibacterial soaps. If your skin becomes reddened/irritated stop using the CHG.  ?Do not shave (including legs and underarms) for at least 48 hours prior to first CHG shower. It is OK to shave your face. ? ?Please follow these instructions carefully. ?  ? ? Shower the NIGHT BEFORE SURGERY and the MORNING OF SURGERY with CHG Soap.  ? If you chose to wash your hair, wash your hair first as usual with your normal shampoo. After you shampoo, rinse your hair and body thoroughly to remove the shampoo.  Then [employer] and genitals  (private parts) with your normal soap and rinse thoroughly to remove soap. ? ?After that Use CHG Soap as you would any other liquid soap. You can apply CHG directly to the skin and wash gently with a scrungie or a clean washcloth.  ? ?Apply the CHG Soap to your body ONLY FROM THE NECK DOWN.  Do not use on open wounds or open sores. Avoid contact with your eyes, ears, mouth and genitals (private parts). Wash Face and genitals (private parts)  with your normal soap.  ? ?Wash thoroughly, paying special attention to the area where your surgery will be performed. ? ?Thoroughly rinse your body with warm water from the neck down. ? ?DO NOT shower/wash with your normal soap after using and rinsing off the CHG Soap. ? ?Pat yourself dry with a CLEAN TOWEL. ? ?Wear CLEAN PAJAMAS to bed the night before surgery ? ?Place CLEAN SHEETS on your bed the night before your surgery ? ?DO NOT SLEEP WITH PETS. ? ? ?Day of Surgery: ? ?Take a shower with CHG soap. ?Wear Clean/Comfortable clothing the morning of surgery ?Do not apply any deodorants/lotions.   ?Remember to brush your teeth WITH YOUR REGULAR TOOTHPASTE. ? ? ? ?COVID testing ? ?If you are going to stay overnight or be admitted after your procedure/surgery and require a pre-op COVID test, please follow these instructions after your COVID test  ? ?You are not required to quarantine however you are required to wear a well-fitting mask when you are out and around people not in your household.  If your mask becomes wet or soiled, replace with a new one. ? ?Wash your hands often with soap and water for 20 seconds or clean your hands with an alcohol-based hand sanitizer that contains at least 60% alcohol. ? ?Do not share personal items. ? ?Notify your provider: ?if you are in close contact with someone who has COVID  ?or if you develop a fever of 100.4 or greater, sneezing, cough, sore throat, shortness of breath or body aches. ? ?  ?Please read over the following fact sheets that  you were given.  ? ?

## 2022-01-08 ENCOUNTER — Encounter (HOSPITAL_COMMUNITY): Payer: Self-pay

## 2022-01-08 ENCOUNTER — Encounter (HOSPITAL_COMMUNITY)
Admission: RE | Admit: 2022-01-08 | Discharge: 2022-01-08 | Disposition: A | Discharge status: Home / Self Care | Payer: BC Managed Care – PPO | Source: Ambulatory Visit | Attending: Plastic Surgery | Admitting: Plastic Surgery

## 2022-01-08 ENCOUNTER — Other Ambulatory Visit: Payer: Self-pay

## 2022-01-08 VITALS — BP 132/72 | HR 85 | Temp 98.1°F | Resp 16 | Ht 65.0 in | Wt 120.9 lb

## 2022-01-08 DIAGNOSIS — I2510 Atherosclerotic heart disease of native coronary artery without angina pectoris: Secondary | ICD-10-CM

## 2022-01-08 DIAGNOSIS — Z01818 Encounter for other preprocedural examination: Secondary | ICD-10-CM

## 2022-01-08 DIAGNOSIS — Z01812 Encounter for preprocedural laboratory examination: Secondary | ICD-10-CM | POA: Diagnosis not present

## 2022-01-08 HISTORY — DX: Pneumonia, unspecified organism: J18.9

## 2022-01-08 HISTORY — DX: Depression, unspecified: F32.A

## 2022-01-08 HISTORY — DX: Anxiety disorder, unspecified: F41.9

## 2022-01-08 LAB — BASIC METABOLIC PANEL
Anion gap: 8 (ref 5–15)
BUN: 10 mg/dL (ref 8–23)
CO2: 27 mmol/L (ref 22–32)
Calcium: 8.9 mg/dL (ref 8.9–10.3)
Chloride: 101 mmol/L (ref 98–111)
Creatinine, Ser: 0.75 mg/dL (ref 0.44–1.00)
GFR, Estimated: >60 mL/min (ref >60)
Glucose, Bld: 85 mg/dL (ref 70–99)
Potassium: 4.1 mmol/L (ref 3.5–5.1)
Sodium: 136 mmol/L (ref 135–145)

## 2022-01-08 LAB — CBC
HCT: 38.5 % (ref 36.0–46.0)
Hemoglobin: 12.4 g/dL (ref 12.0–15.0)
MCH: 28.3 pg (ref 26.0–34.0)
MCHC: 32.2 g/dL (ref 30.0–36.0)
MCV: 87.9 fL (ref 80.0–100.0)
Platelets: 399 10*3/uL (ref 150–400)
RBC: 4.38 MIL/uL (ref 3.87–5.11)
RDW: 13.4 % (ref 11.5–15.5)
WBC: 10.6 10*3/uL — ABNORMAL HIGH (ref 4.0–10.5)
nRBC: 0 % (ref 0.0–0.2)

## 2022-01-08 NOTE — Pre-Procedure Instructions (Signed)
Surgical Instructions ? ? ? Your procedure is scheduled on Tuesday 01/13/22. ? ? Report to [facility] Main Entrance "A" at 10:45 A.M., then check in with the Admitting office. ? Call this number if you have problems the morning of surgery: ? [phone] ? ? If you have any questions prior to your surgery date call [phone]: Open Monday-Friday 8am-4pm ? ? ? Remember: ? Do not eat or drink after midnight the night before your surgery ? ?  ? Take these medicines the morning of surgery with A SIP OF WATER:  ? triamcinolone (NASACORT)  ? ? Take these medicines if needed:  ? acetaminophen (TYLENOL) ? SUMAtriptan (IMITREX)  ? traMADol Veatrice Bourbon) ? ? ?As of today, STOP taking any Aspirin (unless otherwise instructed by your surgeon) Aleve, Naproxen, Ibuprofen, Motrin, Advil, Goody's, BC's, all herbal medications, fish oil, and all vitamins. ? ?         ?Do not wear jewelry or makeup ?Do not wear lotions, powders, perfumes/colognes, or deodorant. ?Do not shave 48 hours prior to surgery.  Men may shave face and neck. ?Do not bring valuables to the hospital. ?Do not wear nail polish, gel polish, artificial nails, or any other type of covering on natural nails (fingers and toes) ?If you have artificial nails or gel coating that need to be removed by a nail salon, please have this removed prior to surgery. Artificial nails or gel coating may interfere with anesthesia's ability to adequately monitor your vital signs. ? ?[facility] is not responsible for any belongings or valuables. .  ? ?Do NOT Smoke (Tobacco/Vaping)  24 hours prior to your procedure ? ?If you use a CPAP at night, you may bring your mask for your overnight stay. ?  ?Contacts, glasses, hearing aids, dentures or partials may not be worn into surgery, please bring cases for these belongings ?  ?For patients admitted to the hospital, discharge time will be determined by your treatment team. ?  ?Patients discharged the day of surgery will not be allowed to drive  home, and someone needs to stay with them for 24 hours. ? ?NO VISITORS WILL BE ALLOWED IN PRE-OP WHERE PATIENTS ARE PREPPED FOR SURGERY.  ONLY 1 SUPPORT PERSON MAY BE PRESENT IN THE WAITING ROOM WHILE YOU ARE IN SURGERY.  IF YOU ARE TO BE ADMITTED, ONCE YOU ARE IN YOUR ROOM YOU WILL BE ALLOWED TWO (2) VISITORS. 1 (ONE) VISITOR MAY STAY OVERNIGHT BUT MUST ARRIVE TO THE ROOM BY 8pm.  Minor children may have two parents present. Special consideration for safety and communication needs will be reviewed on a case by case basis. ? ?Special instructions:   ? ?Oral Hygiene is also important to reduce your risk of infection.  Remember - BRUSH YOUR TEETH THE MORNING OF SURGERY WITH YOUR REGULAR TOOTHPASTE ? ? ?[facility]- Preparing For Surgery ? ?Before surgery, you can play an important role. Because skin is not sterile, your skin needs to be as free of germs as possible. You can reduce the number of germs on your skin by washing with CHG (chlorahexidine gluconate) Soap before surgery.  CHG is an antiseptic cleaner which kills germs and bonds with the skin to continue killing germs even after washing.   ? ? ?Please do not use if you have an allergy to CHG or antibacterial soaps. If your skin becomes reddened/irritated stop using the CHG.  ?Do not shave (including legs and underarms) for at least 48 hours prior to first CHG shower. It is  OK to shave your face. ? ?Please follow these instructions carefully. ?  ? ? Shower the NIGHT BEFORE SURGERY and the MORNING OF SURGERY with CHG Soap.  ? If you chose to wash your hair, wash your hair first as usual with your normal shampoo. After you shampoo, rinse your hair and body thoroughly to remove the shampoo.  Then [employer] and genitals (private parts) with your normal soap and rinse thoroughly to remove soap. ? ?After that Use CHG Soap as you would any other liquid soap. You can apply CHG directly to the skin and wash gently with a scrungie or a clean washcloth.  ? ?Apply the CHG  Soap to your body ONLY FROM THE NECK DOWN.  Do not use on open wounds or open sores. Avoid contact with your eyes, ears, mouth and genitals (private parts). Wash Face and genitals (private parts)  with your normal soap.  ? ?Wash thoroughly, paying special attention to the area where your surgery will be performed. ? ?Thoroughly rinse your body with warm water from the neck down. ? ?DO NOT shower/wash with your normal soap after using and rinsing off the CHG Soap. ? ?Pat yourself dry with a CLEAN TOWEL. ? ?Wear CLEAN PAJAMAS to bed the night before surgery ? ?Place CLEAN SHEETS on your bed the night before your surgery ? ?DO NOT SLEEP WITH PETS. ? ? ?Day of Surgery: ? ?Take a shower with CHG soap. ?Wear Clean/Comfortable clothing the morning of surgery ?Do not apply any deodorants/lotions.   ?Remember to brush your teeth WITH YOUR REGULAR TOOTHPASTE. ? ? ? ?COVID testing ? ?If you are going to stay overnight or be admitted after your procedure/surgery and require a pre-op COVID test, please follow these instructions after your COVID test  ? ?You are not required to quarantine however you are required to wear a well-fitting mask when you are out and around people not in your household.  If your mask becomes wet or soiled, replace with a new one. ? ?Wash your hands often with soap and water for 20 seconds or clean your hands with an alcohol-based hand sanitizer that contains at least 60% alcohol. ? ?Do not share personal items. ? ?Notify your provider: ?if you are in close contact with someone who has COVID  ?or if you develop a fever of 100.4 or greater, sneezing, cough, sore throat, shortness of breath or body aches. ? ?  ?Please read over the following fact sheets that you were given.  ? ?

## 2022-01-08 NOTE — Progress Notes (Signed)
PCP - Marda Stalker NP ?Cardiologist - Denies ? ?PPM/ICD - Denies ? ?Chest x-ray - N/A ?EKG - 12/05/20 ?Stress Test - Denies ?ECHO - 11/17/19 ?Cardiac Cath - Denies ? ?Sleep Study - Denies ? ?Patient denies having diabetes. ? ?Blood Thinner Instructions: N/A ?Aspirin Instructions: N/A ? ?ERAS Protcol - No ? ?COVID TEST- N/A ? ? ?Anesthesia review: yes, cardiac hx ? ?Patient denies shortness of breath, fever, cough and chest pain at PAT appointment ? ? ?All instructions explained to the patient, with a verbal understanding of the material. Patient agrees to go over the instructions while at home for a better understanding. Patient also instructed to self quarantine after being tested for COVID-19. The opportunity to ask questions was provided. ? ? ?

## 2022-01-13 ENCOUNTER — Ambulatory Visit (HOSPITAL_COMMUNITY): Payer: BC Managed Care – PPO | Admitting: Physician Assistant

## 2022-01-13 ENCOUNTER — Other Ambulatory Visit: Payer: Self-pay

## 2022-01-13 ENCOUNTER — Ambulatory Visit (HOSPITAL_COMMUNITY)
Admission: RE | Admit: 2022-01-13 | Discharge: 2022-01-13 | Disposition: A | Discharge status: Home / Self Care | Payer: BC Managed Care – PPO | Attending: Plastic Surgery | Admitting: Plastic Surgery

## 2022-01-13 ENCOUNTER — Encounter (HOSPITAL_COMMUNITY)
Admission: RE | Disposition: A | Discharge status: Home / Self Care | Payer: Self-pay | Source: Home / Self Care | Attending: Plastic Surgery

## 2022-01-13 ENCOUNTER — Encounter (HOSPITAL_COMMUNITY): Payer: Self-pay | Admitting: Plastic Surgery

## 2022-01-13 ENCOUNTER — Ambulatory Visit (HOSPITAL_COMMUNITY): Payer: BC Managed Care – PPO | Admitting: Certified Registered"

## 2022-01-13 DIAGNOSIS — C44319 Basal cell carcinoma of skin of other parts of face: Secondary | ICD-10-CM

## 2022-01-13 DIAGNOSIS — F17210 Nicotine dependence, cigarettes, uncomplicated: Secondary | ICD-10-CM | POA: Diagnosis not present

## 2022-01-13 DIAGNOSIS — I10 Essential (primary) hypertension: Secondary | ICD-10-CM | POA: Insufficient documentation

## 2022-01-13 SURGERY — ADJACENT TISSUE TRANSFER
Anesthesia: General | Site: Chin

## 2022-01-13 MED ORDER — ORAL CARE MOUTH RINSE: 15.0000 mL | Freq: Once | OROMUCOSAL | Status: AC

## 2022-01-13 MED ORDER — CHLORHEXIDINE GLUCONATE CLOTH 2 % EX PADS: 6.0000 | MEDICATED_PAD | Freq: Once | CUTANEOUS | Status: DC

## 2022-01-13 MED ORDER — LIDOCAINE 2% (20 MG/ML) 5 ML SYRINGE
INTRAMUSCULAR | Status: DC | PRN
  Administered 2022-01-13: 60 mg via INTRAVENOUS

## 2022-01-13 MED ORDER — PROPOFOL 10 MG/ML IV BOLUS
INTRAVENOUS | Status: DC | PRN
Start: 2022-01-13 — End: 2022-01-13
  Administered 2022-01-13: 150 mg via INTRAVENOUS

## 2022-01-13 MED ORDER — CHLORHEXIDINE GLUCONATE 0.12 % MT SOLN
OROMUCOSAL | Status: AC
  Administered 2022-01-13: 15 mL via OROMUCOSAL
  Filled 2022-01-13: qty 15

## 2022-01-13 MED ORDER — SUGAMMADEX SODIUM 200 MG/2ML IV SOLN
INTRAVENOUS | Status: DC | PRN
  Administered 2022-01-13: 200 mg via INTRAVENOUS

## 2022-01-13 MED ORDER — BUPIVACAINE-EPINEPHRINE (PF) 0.25% -1:200000 IJ SOLN
INTRAMUSCULAR | Status: AC
  Filled 2022-01-13: qty 30

## 2022-01-13 MED ORDER — ROCURONIUM BROMIDE 10 MG/ML (PF) SYRINGE
PREFILLED_SYRINGE | INTRAVENOUS | Status: DC | PRN
  Administered 2022-01-13: 60 mg via INTRAVENOUS

## 2022-01-13 MED ORDER — CHLORHEXIDINE GLUCONATE 0.12 % MT SOLN: 15.0000 mL | Freq: Once | OROMUCOSAL | Status: AC

## 2022-01-13 MED ORDER — ONDANSETRON HCL 4 MG/2ML IJ SOLN
INTRAMUSCULAR | Status: DC | PRN
  Administered 2022-01-13: 4 mg via INTRAVENOUS

## 2022-01-13 MED ORDER — CEFAZOLIN SODIUM-DEXTROSE 2-4 GM/100ML-% IV SOLN
INTRAVENOUS | Status: AC
  Filled 2022-01-13: qty 100

## 2022-01-13 MED ORDER — LIDOCAINE-EPINEPHRINE 1 %-1:100000 IJ SOLN
INTRAMUSCULAR | Status: AC
  Filled 2022-01-13: qty 1

## 2022-01-13 MED ORDER — PROPOFOL 1000 MG/100ML IV EMUL
INTRAVENOUS | Status: AC
  Filled 2022-01-13: qty 100

## 2022-01-13 MED ORDER — FENTANYL CITRATE (PF) 250 MCG/5ML IJ SOLN
INTRAMUSCULAR | Status: AC
  Filled 2022-01-13: qty 5

## 2022-01-13 MED ORDER — BACITRACIN ZINC 500 UNIT/GM EX OINT
TOPICAL_OINTMENT | CUTANEOUS | Status: AC
  Filled 2022-01-13: qty 28.35

## 2022-01-13 MED ORDER — LACTATED RINGERS IV SOLN: INTRAVENOUS | Status: DC

## 2022-01-13 MED ORDER — TRAMADOL HCL 50 MG PO TABS: 50.0000 mg | ORAL_TABLET | Freq: Four times a day (QID) | ORAL | 0 refills | Status: AC | PRN

## 2022-01-13 MED ORDER — PROPOFOL 10 MG/ML IV BOLUS
INTRAVENOUS | Status: AC
  Filled 2022-01-13: qty 20

## 2022-01-13 MED ORDER — FENTANYL CITRATE (PF) 100 MCG/2ML IJ SOLN
INTRAMUSCULAR | Status: DC | PRN
  Administered 2022-01-13: 100 ug via INTRAVENOUS

## 2022-01-13 MED ORDER — CEFAZOLIN SODIUM-DEXTROSE 2-4 GM/100ML-% IV SOLN
2.0000 g | INTRAVENOUS | Status: AC
  Administered 2022-01-13: 2 g via INTRAVENOUS

## 2022-01-13 MED ORDER — BACITRACIN ZINC 500 UNIT/GM EX OINT
TOPICAL_OINTMENT | CUTANEOUS | Status: DC | PRN
  Administered 2022-01-13: 1 via TOPICAL

## 2022-01-13 MED ORDER — BUPIVACAINE-EPINEPHRINE 0.25% -1:200000 IJ SOLN
INTRAMUSCULAR | Status: DC | PRN
  Administered 2022-01-13: 5 mL

## 2022-01-13 MED ORDER — ACETAMINOPHEN 500 MG PO TABS
1000.0000 mg | ORAL_TABLET | Freq: Once | ORAL | Status: AC
  Administered 2022-01-13: 1000 mg via ORAL
  Filled 2022-01-13: qty 2

## 2022-01-13 MED ORDER — PHENYLEPHRINE HCL-NACL 20-0.9 MG/250ML-% IV SOLN
INTRAVENOUS | Status: DC | PRN
  Administered 2022-01-13: 25 ug/min via INTRAVENOUS

## 2022-01-13 SURGICAL SUPPLY — 29 items
APL PRP STRL LF DISP 70% ISPRP (MISCELLANEOUS) ×1
BLADE SURG 15 STRL LF DISP TIS (BLADE) ×1 IMPLANT
BLADE SURG 15 STRL SS (BLADE) ×2
CANISTER SUCT 1200ML W/VALVE (MISCELLANEOUS) ×2 IMPLANT
CHLORAPREP W/TINT 26 (MISCELLANEOUS) ×2 IMPLANT
COVER BACK TABLE 60X90IN (DRAPES) ×2 IMPLANT
COVER SURGICAL LIGHT HANDLE (MISCELLANEOUS) ×2 IMPLANT
ELECT COATED BLADE 2.86 ST (ELECTRODE) ×1 IMPLANT
ELECT REM PT RETURN 9FT ADLT (ELECTROSURGICAL) ×2
ELECTRODE REM PT RTRN 9FT ADLT (ELECTROSURGICAL) ×1 IMPLANT
GLOVE SURG ENC TEXT LTX SZ7.5 (GLOVE) ×4 IMPLANT
GLOVE SURG UNDER LTX SZ8 (GLOVE) ×2 IMPLANT
GOWN STRL REUS W/ TWL LRG LVL3 (GOWN DISPOSABLE) ×2 IMPLANT
GOWN STRL REUS W/TWL LRG LVL3 (GOWN DISPOSABLE) ×6
NDL 27GX1/2 REG BEVEL ECLIP (NEEDLE) IMPLANT
NEEDLE 27GX1/2 REG BEVEL ECLIP (NEEDLE) ×2 IMPLANT
NEEDLE HYPO 22GX1.5 SAFETY (NEEDLE) ×2 IMPLANT
NS IRRIG 1000ML POUR BTL (IV SOLUTION) ×2 IMPLANT
PENCIL SMOKE EVACUATOR (MISCELLANEOUS) ×2 IMPLANT
SPONGE T-LAP 18X18 ~~LOC~~+RFID (SPONGE) ×1 IMPLANT
STAPLER VISISTAT 35W (STAPLE) ×2 IMPLANT
SUT PDS AB 3-0 SH 27 (SUTURE) ×2 IMPLANT
SUT PROLENE 4 0 RB 1 (SUTURE) ×2
SUT PROLENE 4-0 RB1 18X2 ARM (SUTURE) IMPLANT
SUT PROLENE 5 0 CC1 (SUTURE) ×1 IMPLANT
SYR CONTROL 10ML LL (SYRINGE) ×2 IMPLANT
TOWEL GREEN STERILE FF (TOWEL DISPOSABLE) ×2 IMPLANT
TRAY ENT MC OR (CUSTOM PROCEDURE TRAY) ×2 IMPLANT
TUBE CONNECTING 20X1/4 (TUBING) ×2 IMPLANT

## 2022-01-13 NOTE — Anesthesia Postprocedure Evaluation (Signed)
Anesthesia Post Note ? ?Patient: Tonya Delgado ? ?Procedure(s) Performed: Repair of chin with local tissue rearrangement (Chin) ? ?  ? ?Patient location during evaluation: PACU ?Anesthesia Type: General ?Level of consciousness: awake and alert ?Pain management: pain level controlled ?Vital Signs Assessment: post-procedure vital signs reviewed and stable ?Respiratory status: spontaneous breathing, nonlabored ventilation, respiratory function stable and patient connected to nasal cannula oxygen ?Cardiovascular status: blood pressure returned to baseline and stable ?Postop Assessment: no apparent nausea or vomiting ?Anesthetic complications: no ? ? ?No notable events documented. ? ?Last Vitals:  ?Vitals:  ? 01/13/22 1308 01/13/22 1323  ?BP: (!) 149/78 (!) 152/79  ?Pulse:  79  ?Resp:  (!) 24  ?Temp: 36.6 ?C   ?SpO2:  97%  ?  ?Last Pain:  ?Vitals:  ? 01/13/22 1323  ?TempSrc:   ?PainSc: 3   ? ? ?  ?  ?  ?  ?  ?  ? ?Jamarl Pew L Aren Pryde ? ? ? ? ?

## 2022-01-13 NOTE — Anesthesia Preprocedure Evaluation (Addendum)
Anesthesia Evaluation  ?Patient identified by MRN, date of birth, ID band ?Patient awake ? ? ? ?Reviewed: ?Allergy & Precautions, NPO status , Patient's Chart, lab work & pertinent test results ? ?Airway ?Mallampati: II ? ?TM Distance: >3 FB ?Neck ROM: Full ? ? ? Dental ?no notable dental hx. ?(+) Teeth Intact, Dental Advisory Given ?  ?Pulmonary ?neg pulmonary ROS, Current Smoker,  ?  ?Pulmonary exam normal ?breath sounds clear to auscultation ? ? ? ? ? ? Cardiovascular ?hypertension, Pt. on medications ?Normal cardiovascular exam ?Rhythm:Regular Rate:Normal ? ? ?  ?Neuro/Psych ?PSYCHIATRIC DISORDERS Anxiety Depression negative neurological ROS ?   ? GI/Hepatic ?negative GI ROS, Neg liver ROS,   ?Endo/Other  ?negative endocrine ROS ? Renal/GU ?negative Renal ROS  ?negative genitourinary ?  ?Musculoskeletal ?negative musculoskeletal ROS ?(+)  ? Abdominal ?  ?Peds ? Hematology ?negative hematology ROS ?(+)   ?Anesthesia Other Findings ? ? Reproductive/Obstetrics ? ?  ? ? ? ? ? ? ? ? ? ? ? ? ? ?  ?  ? ? ? ? ? ? ?Anesthesia Physical ?Anesthesia Plan ? ?ASA: 2 ? ?Anesthesia Plan: General  ? ?Post-op Pain Management: Tylenol PO (pre-op)*  ? ?Induction: Intravenous ? ?PONV Risk Score and Plan: 2 and Ondansetron, Dexamethasone and Midazolam ? ?Airway Management Planned: Oral ETT ? ?Additional Equipment:  ? ?Intra-op Plan:  ? ?Post-operative Plan: Extubation in OR ? ?Informed Consent: I have reviewed the patients History and Physical, chart, labs and discussed the procedure including the risks, benefits and alternatives for the proposed anesthesia with the patient or authorized representative who has indicated his/her understanding and acceptance.  ? ? ? ?Dental advisory given ? ?Plan Discussed with: CRNA ? ?Anesthesia Plan Comments:   ? ? ? ? ? ?Anesthesia Quick Evaluation ? ?

## 2022-01-13 NOTE — Interval H&P Note (Signed)
History and Physical Interval Note: ? ?01/13/2022 ?11:24 AM ? ?Tonya Delgado  has presented today for surgery, with the diagnosis of Basal Cell Carcinoma of chin.  The various methods of treatment have been discussed with the patient and family. After consideration of risks, benefits and other options for treatment, the patient has consented to  Procedure(s) with comments: ?Repair of chin with complex closure or local tissue rearrangement, possible mucosal repair (N/A) - 1 hr as a surgical intervention.  The patient's history has been reviewed, patient examined, no change in status, stable for surgery.  I have reviewed the patient's chart and labs.  Questions were answered to the patient's satisfaction.   ? ? ?[name] ? ? ?

## 2022-01-13 NOTE — Transfer of Care (Signed)
Immediate Anesthesia Transfer of Care Note ? ?Patient: Tonya Delgado ? ?Procedure(s) Performed: Repair of chin with local tissue rearrangement (Chin) ? ?Patient Location: PACU ? ?Anesthesia Type:General ? ?Level of Consciousness: drowsy and patient cooperative ? ?Airway & Oxygen Therapy: Patient Spontanous Breathing ? ?Post-op Assessment: Report given to RN and Post -op Vital signs reviewed and stable ? ?Post vital signs: Reviewed and stable ? ?Last Vitals:  ?Vitals Value Taken Time  ?BP    ?Temp    ?Pulse 89 01/13/22 1308  ?Resp 20 01/13/22 1308  ?SpO2 96 % 01/13/22 1308  ?Vitals shown include unvalidated device data. ? ?Last Pain:  ?Vitals:  ? 01/13/22 1046  ?TempSrc:   ?PainSc: 0-No pain  ?   ? ?  ? ?Complications: No notable events documented. ?

## 2022-01-13 NOTE — Op Note (Signed)
Operative Note  ? ?DATE OF OPERATION: 01/13/2022 ? ?SURGICAL DEPARTMENT: Plastic Surgery ? 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 ?POSTOPERATIVE DIAGNOSES:  same ? ?PROCEDURE:  5x2.5 cm local tissue rearrangment of chin ? ?SURGEON: Melene Plan. Remingtyn Depaola, MD ? ?ASSISTANT: Krista Blue, PA ? ?ANESTHESIA:  General.  ? ?COMPLICATIONS: None.  ? ?INDICATIONS FOR PROCEDURE:  ?The patient, Tonya Delgado is a 66 y.o. female born on [birth year], is here for treatment of open wound chin. ?MRN: [id number] ? ?CONSENT:  ?Informed consent was obtained directly from the patient. Risks, benefits and alternatives were fully discussed. Specific risks including but not limited to bleeding, infection, hematoma, seroma, scarring, pain, contracture, asymmetry, wound healing problems, and need for further surgery were all discussed. The patient did have an ample opportunity to have questions answered to satisfaction.  ? ?DESCRIPTION OF PROCEDURE:  ?The patient was taken to the operating room. SCDs were placed and preoperative antibiotics were given. General anesthesia was administered.  The patient's operative site was prepped and draped in a sterile fashion. A time out was performed and all information was confirmed to be correct.   ? ?Patient was taken to the operative room and laid in the supine position.  After prepping I injected the left face with quarter Marcaine with epinephrine.  I irrigated the mouth with saline and noted that there is no fluid coming out through the skin opening so the fistula was closed.  Following this the eschar was excised using a 15 blade and the scar was sent to pathology.  Following this 15 blade was used to make 2 back cuts lateral to the superior part of the the wound.  Area was widely undermined and then sutured in a layered fashion using 3-0 PDS for dermis followed by interrupted Prolene mattress sutures for skin.  Satisfactory closure was able to be achieved. ? ? ? ?The  patient tolerated the procedure well.  There were no complications. The patient was allowed to wake from anesthesia, extubated and taken to the recovery room in satisfactory condition.   ?

## 2022-01-13 NOTE — Anesthesia Procedure Notes (Signed)
Procedure Name: Intubation ?Date/Time: 01/13/2022 12:11 PM ?Performed by: Georgia Duff, CRNA ?Pre-anesthesia Checklist: Patient identified, Emergency Drugs available, Suction available and Patient being monitored ?Patient Re-evaluated:Patient Re-evaluated prior to induction ?Oxygen Delivery Method: Circle System Utilized ?Preoxygenation: Pre-oxygenation with 100% oxygen ?Induction Type: IV induction ?Ventilation: Mask ventilation without difficulty ?Laryngoscope Size: Glidescope and 3 ?Grade View: Grade I ?Tube type: Oral ?Tube size: 7.0 mm ?Number of attempts: 1 ?Airway Equipment and Method: Stylet and Oral airway ?Placement Confirmation: ETT inserted through vocal cords under direct vision, positive ETCO2 and breath sounds checked- equal and bilateral ?Secured at: 21 cm ?Tube secured with: Tape ?Dental Injury: Teeth and Oropharynx as per pre-operative assessment  ? ? ? ? ?

## 2022-01-14 ENCOUNTER — Telehealth: Payer: Self-pay

## 2022-01-14 ENCOUNTER — Encounter (HOSPITAL_COMMUNITY): Payer: Self-pay | Admitting: Plastic Surgery

## 2022-01-14 NOTE — Telephone Encounter (Signed)
Spoke to patient. She stated she just had gauze and tape. I informed to not soak her chin. Remove gauze to shower, pat dry and replace dressing as removed.  ?

## 2022-01-14 NOTE — Telephone Encounter (Signed)
Tonya Delgado had surgery yesterday on her chin, but was given no post op instructions on how to care for the wound. Asked for a call back, as she wants to know if there is any products she can/can not put on it and if it is okay to wash her face. ?

## 2022-01-14 NOTE — Telephone Encounter (Signed)
LMTRC

## 2022-01-15 LAB — SURGICAL PATHOLOGY

## 2022-01-19 ENCOUNTER — Ambulatory Visit (INDEPENDENT_AMBULATORY_CARE_PROVIDER_SITE_OTHER): Payer: BC Managed Care – PPO | Admitting: Plastic Surgery

## 2022-01-19 ENCOUNTER — Other Ambulatory Visit: Payer: Self-pay

## 2022-01-19 DIAGNOSIS — C44319 Basal cell carcinoma of skin of other parts of face: Secondary | ICD-10-CM

## 2022-01-20 ENCOUNTER — Encounter: Payer: Self-pay | Admitting: Plastic Surgery

## 2022-01-20 NOTE — Progress Notes (Signed)
Status post repair of chin with local tissue arrangement.  She is overall doing well.  ? ?Physical exam ?Incision intact with 2 mm of eschar along incision ? ?Assessment and plan ?We will continue to follow and plan to remove sutures next week. ?

## 2022-01-26 ENCOUNTER — Other Ambulatory Visit: Payer: Self-pay

## 2022-01-26 ENCOUNTER — Ambulatory Visit (INDEPENDENT_AMBULATORY_CARE_PROVIDER_SITE_OTHER): Payer: BC Managed Care – PPO | Admitting: Plastic Surgery

## 2022-01-26 ENCOUNTER — Encounter: Payer: Self-pay | Admitting: Plastic Surgery

## 2022-01-26 DIAGNOSIS — C44319 Basal cell carcinoma of skin of other parts of face: Secondary | ICD-10-CM

## 2022-01-26 NOTE — Progress Notes (Signed)
Status post readvancement of skin closure of chin defect.  The patient has scant serous drainage usually. ? ?Physical exam ?Improved contour, no drainage noted in the office today ? ?Assessment and plan ?Patient is doing well and that her contour is markedly improved.  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  We will continue to follow her closely before she starts radiation. ?

## 2022-02-09 ENCOUNTER — Ambulatory Visit (INDEPENDENT_AMBULATORY_CARE_PROVIDER_SITE_OTHER): Payer: BC Managed Care – PPO | Admitting: Plastic Surgery

## 2022-02-09 DIAGNOSIS — C44319 Basal cell carcinoma of skin of other parts of face: Secondary | ICD-10-CM

## 2022-02-09 NOTE — Progress Notes (Signed)
Status post reconstruction of chin after basal cell carcinoma excision with Mohs.  Patient notes no drainage.  She is concerned about scarring and in particular the wrinkling of her skin. ? ?Physical exam  ?incision clean dry intact, satisfactory contour, no drainage ? ?Assessment and plan ?Patient is now healed.  No evidence of fistula.  We will have to readdress whether radiation may be indicated either for deep margin or for recurrence of basal cell after previous excision and recurrence years later.  Now that she is healed I am fine with her starting radiation at any point. ?

## 2022-02-23 ENCOUNTER — Ambulatory Visit (INDEPENDENT_AMBULATORY_CARE_PROVIDER_SITE_OTHER): Payer: BC Managed Care – PPO | Admitting: Plastic Surgery

## 2022-02-23 DIAGNOSIS — C44319 Basal cell carcinoma of skin of other parts of face: Secondary | ICD-10-CM

## 2022-02-25 NOTE — Progress Notes (Signed)
Patient is status post Mohs surgery for basal cell carcinoma in the chin.  This was recurrent from a remote excision.  Patient notes there is now no drainage of the wound but she is concerned about the contour of her chin.  She had revision on 3/14 and is now healed. ? ?Physical exam ?Patient is healed on the skin side with a small area of concavity centrally.  Skin is intact with no drainage.  No significant erythema. ? ?Assessment and plan ?1) concerning the patient's contour I think that given the size of her initial defect that she is healing well.  I do not have a good plan to improve her contour acutely although she may be a candidate for some filler injection or laser resurfacing in the future.  I am going to have her see Dr. Claudia Desanctis my partner who is aware of her case to see if he has any suggestions for revision. ? ?2) now that the patient is healed I think that she could start radiation therapy whenever Dr. Isidore Moos wants to proceed. ?

## 2022-03-11 ENCOUNTER — Encounter: Payer: Self-pay | Admitting: Plastic Surgery

## 2022-03-11 ENCOUNTER — Ambulatory Visit (INDEPENDENT_AMBULATORY_CARE_PROVIDER_SITE_OTHER): Payer: BC Managed Care – PPO | Admitting: Plastic Surgery

## 2022-03-11 DIAGNOSIS — C44319 Basal cell carcinoma of skin of other parts of face: Secondary | ICD-10-CM

## 2022-03-11 NOTE — Progress Notes (Signed)
? ?  Referring Provider ?Marda Stalker, PA-C ?[facility] ?[place],  [place] [address]  ? ?CC:  ?Chief Complaint  ?Patient presents with  ? Follow-up  ?   ? ?Tonya Delgado is an 66 y.o. female.  ?HPI: Patient presents to discuss scar in her chin.  She had a recurrent basal cell that was removed recently with Mohs excision and required 2 procedures to get it closed.  The initial procedure was closed with adjacent tissue transfer and she subsequently developed a small fistula which ultimately healed secondarily.  An additional small revision procedure was done to help get the skin closed.  She is currently bothered by the contour although recognizes that ultimately she does have a good result given the initial size of the defect and its location.  ? ?Review of Systems ?General: Denies fevers or chills ? ?Physical Exam ? ?  01/13/2022  ?  1:23 PM 01/13/2022  ?  1:08 PM 01/13/2022  ? 10:34 AM  ?Vitals with BMI  ?Height   '5\' 5"'$   ?Weight   120 lbs  ?BMI   19.97  ?Systolic 127 517 001  ?Diastolic 79 78 88  ?Pulse 79  84  ?  ?General:  No acute distress,  Alert and oriented, Non-Toxic, Normal speech and affect ?On examination she has completely healed intraoral space.  Skin is totally healed as well.  There is some grooves that have developed in the area of her scars and a central depression where the skin appears tethered.  This makes some of the surrounding skin look fuller laterally and more inferiorly. ? ?Assessment/Plan ?Patient presents with what looks like a tethered scar after her chin closure with adjacent tissue transfer.  I agree with her that her result is overall very good considering the initial size of the defect and its depth.  I did bring up the idea of a dermal fat graft from the abdomen to try to help release the scar in place something beneath it to fill it out.  I do not think that there would be much downside to this and would be hopeful that it would give her a smoother contour.  I did explain  that the results can be somewhat unpredictable but I thought it was worth a try.  She also is planning to have radiation on this area and I do agree that doing some dermal fat graft prior to radiation would be preferable as long as the timing is okay.  We reviewed the risks and benefits of the procedure and we will plan to move forward with it.  I will contact her radiation oncologist to make sure that this will work for them as well. ? ?Cindra Presume ?03/11/2022, 2:33 PM  ? ? ?    ?

## 2022-03-18 ENCOUNTER — Ambulatory Visit (INDEPENDENT_AMBULATORY_CARE_PROVIDER_SITE_OTHER): Payer: BC Managed Care – PPO | Admitting: Plastic Surgery

## 2022-03-18 ENCOUNTER — Encounter: Payer: Self-pay | Admitting: Plastic Surgery

## 2022-03-18 VITALS — BP 142/82 | HR 95

## 2022-03-18 DIAGNOSIS — C44319 Basal cell carcinoma of skin of other parts of face: Secondary | ICD-10-CM

## 2022-03-18 DIAGNOSIS — Z85828 Personal history of other malignant neoplasm of skin: Secondary | ICD-10-CM | POA: Diagnosis not present

## 2022-03-18 DIAGNOSIS — M952 Other acquired deformity of head: Secondary | ICD-10-CM | POA: Diagnosis not present

## 2022-03-18 NOTE — Progress Notes (Signed)
Operative Note  ? ?DATE OF OPERATION: 03/18/2022 ? ?LOCATION:   ? ?SURGICAL DEPARTMENT: Plastic Surgery ? ?PREOPERATIVE DIAGNOSES: Chin contour deformity after skin cancer excision and reconstruction ? ?POSTOPERATIVE DIAGNOSES:  same ? ?PROCEDURE:  ?Surgical preparation for grafting of the central chin totaling 6 x 3 cm ?6 x 3 cm dermal fat graft from the abdomen to the subcutaneous tissues of the chin ? ?SURGEON: Talmadge Coventry, MD ? ?ANESTHESIA:  Local ? ?COMPLICATIONS: None.  ? ?INDICATIONS FOR PROCEDURE:  ?The patient, Tonya Delgado is a 66 y.o. female born on January 10, 1956, is here for treatment of chin contour deformity ?MRN: [id number] ? ?CONSENT:  ?Informed consent was obtained directly from the patient. Risks, benefits and alternatives were fully discussed. Specific risks including but not limited to bleeding, infection, hematoma, seroma, scarring, pain, infection, wound healing problems, and need for further surgery were all discussed. The patient did have an ample opportunity to have questions answered to satisfaction.  ? ?DESCRIPTION OF PROCEDURE:  ?Local anesthesia was administered. The patient's operative site was prepped and draped in a sterile fashion. A time out was performed and all information was confirmed to be correct.  I started by preparing the chin site for grafting.  I incised along her previous scar which is right at the sulcus between the lower lip and the chin.  I then undermined circumferentially in the subcutaneous plane while releasing a lot of the tethering of her previous scar.  Hemostasis was ensured and then I turned my attention to the right abdomen.  This area is been infused with local anesthetic.  I then de-epithelialized a 6 x 3 cm area with a 15 blade.  A dermal fat graft was then harvested with the remainder of the dermis and a few millimeters of underlying fat.  The lateral aspect was then folded around itself and secured in position with Monocryl sutures to give it a  double layered effect.  This was then placed within the chin defect and secured around the periphery with Monocryl sutures to hold it in position.  The abdomen was closed with interrupted buried 4-0 Monocryl in a running 4-0 Monocryl suture.  The chin incision was closed with interrupted buried 4-0 Monocryl sutures and a running 5-0 fast gut.  This gave a nice on table result. ? ?The patient tolerated the procedure well.  There were no complications. ?  ? ?

## 2022-03-25 ENCOUNTER — Ambulatory Visit (INDEPENDENT_AMBULATORY_CARE_PROVIDER_SITE_OTHER): Payer: BC Managed Care – PPO | Admitting: Plastic Surgery

## 2022-03-25 DIAGNOSIS — C44319 Basal cell carcinoma of skin of other parts of face: Secondary | ICD-10-CM

## 2022-03-25 NOTE — Progress Notes (Signed)
Patient presents 1 week postop from dermal fat graft to the chin for impression after skin cancer reconstruction.  She feels like things are going well.  She is overall very happy with the appearance thus far.  She feels like her speech and movement of her lower lip are improved and feels more normal to her.  On exam incision looks to be healing fine.  All sutures were removed.  She has some residual swelling and bruising but generally looks to have a nice result at this point.  I am planning to see her again next week.  Sounds like radiation simulation is planned for next week and I will just make sure 1 more time that everything looks to be good before radiation starts the following week.  All of her questions were answered.

## 2022-03-31 ENCOUNTER — Ambulatory Visit: Payer: BC Managed Care – PPO

## 2022-03-31 ENCOUNTER — Ambulatory Visit: Payer: BC Managed Care – PPO | Admitting: Radiation Oncology

## 2022-03-31 NOTE — Progress Notes (Signed)
Radiation Oncology         [phone] ________________________________  Follow-up New Visit   Outpatient  Name: Tonya Delgado MRN: [id number]  Date: 04/01/2022  DOB: 07/23/1956  PJ:[id number], Loma Sousa, PA-C  Karin Golden, MD   REFERRING PHYSICIAN: Karin Golden, MD  DIAGNOSIS: No diagnosis found.  Basal cell carcinoma -  chin   Cancer Staging  Basal cell carcinoma of chin Staging form: Cutaneous Carcinoma of the Head and Neck, AJCC 8th Edition - Pathologic stage from 12/05/2021: Stage IV (pT4a, pNX, cM0) - Signed by Eppie Gibson, MD on 12/08/2021 Extraosseous extension: Present   CHIEF COMPLAINT: Here to discuss management of skin cancer  Narrative / Interval History : Tonya Delgado is a 66 y.o. female who returns today for further discussion of radiation therapy in management of her infiltrative BCC. Since she was seen for her initial consultation this past February, the patient underwent rearrangement of the central chin and burring of the outer mandible on 12/05/21 under the care of Dr. Erin Hearing.    During subsequent post-op follow up visits with Dr. Erin Hearing, the patient was noted to have developed a small fistula, as well as an area of adhesion centrally to her chin closure.   Subsequently, the patient underwent revision of the chin closure with local tissue arrangement on 01/13/22.  The patient did not have any further issues with healing, though expressed aesthetic concerns regarding her chin contour to Dr. Erin Hearing.   Accordingly, the patient was referred to Dr. Claudia Desanctis, plastic surgery, on 03/11/22 who recommended considering a dermal fat graft from the abdomen.  This was performed on 03/18/22.   HPI - Initial Consultation 12/05/21 :Tonya Delgado is a 66 y.o. 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 Winifred Olive.The scar was then revised with a rotation flap and now the patient has a  recurrence. Dr. Winifred Olive is concerned the rotation flap that was done for scar revision created a discontinuous tumor so Mohs may not give her the high cure rate that it is quoted.     She presented to Dr. Winifred Olive for a right skin shave biopsy on 11/20/21 which revealed infiltrative, ulcerated, basal cell carcinoma. She then underwent a Mohs procedure to this site on 12/01/21. Findings indicated a focally positive margin over a portion of bone.   Subsequently, the patient was referred to Dr. Erin Hearing (plastic surgery) on 12/03/21 to discuss removal of the focally positive margin as well as burring and reconstruction. She will have that performed later today. Following evaluation, Dr. Erin Hearing recommended radiation given the possibly positive margin, in addition to burring of the positive margin.    Per my personal correspondence with Dr Winifred Olive, she has a recurrent infiltrative bcc that was treated around 2016, the scar was then revised with a rotation flap and now the patient has a recurrence.  There was PNI and involvement of periosteum and he did not get clear margins because he cannot burr bone in his office. He thinks the rotation flap that was done for scar revision created a discontinuous tumor so Mohs may not give her the high cure rate that it is quoted.  Attached are photos of the positive area from Dr. Winifred Olive.   History of Blistering sunburns, if any: Yes, significant history of sun exposure    SAFETY ISSUES: Prior radiation? No Pacemaker/ICD? No Possible current pregnancy? No--hysterectomy Is the patient on methotrexate? No   Current  Complaints / other details:  current smoker. Retiring soon from [employer] work. Here with friend today who is a surgical nurse.     PREVIOUS RADIATION THERAPY: No  PAST MEDICAL HISTORY:  has a past medical history of Anxiety, Depression, Hyperlipidemia, Hypertension, Pneumonia, and Skin cancer.    PAST SURGICAL HISTORY: Past Surgical History:  Procedure  Laterality Date   ADJACENT TISSUE TRANSFER/TISSUE REARRANGEMENT N/A 01/13/2022   Procedure: Repair of chin with local tissue rearrangement;  Surgeon: [name], MD;  Location: [place];  Service: Plastics;  Laterality: N/A;  1 hr   APPENDECTOMY     CESAREAN SECTION     x 2   CHOLECYSTECTOMY     DG GALL BLADDER     LAPAROSCOPIC HYSTERECTOMY     SKIN FULL THICKNESS GRAFT N/A 12/05/2021   Procedure: local tissue rearrangment, mucosal closure and burring of mandible bone;  Surgeon: [name], MD;  Location: [facility];  Service: Plastics;  Laterality: N/A;    FAMILY HISTORY: family history includes Heart failure in her father; Hyperlipidemia in her mother; Hypertension in her brother, mother, and sister; Lung cancer in her father and sister.  SOCIAL HISTORY:  reports that she has been smoking cigarettes. She has been smoking an average of .5 packs per day. She has never used smokeless tobacco. She reports current alcohol use. She reports that she does not currently use drugs.  ALLERGIES: Patient has no known allergies.  MEDICATIONS:  Current Outpatient Medications  Medication Sig Dispense Refill   acetaminophen (TYLENOL) 500 MG tablet Take 500 mg by mouth every 6 (six) hours as needed (pain.).     atorvastatin (LIPITOR) 10 MG tablet Take 10 mg by mouth every evening.     busPIRone (BUSPAR) 10 MG tablet Take 10 mg by mouth at bedtime.     Calcium Carb-Cholecalciferol (CALCIUM 600+D3 PO) Take 1 tablet by mouth in the morning.     COLLAGEN-VITAMIN C PO Take 2 tablets by mouth in the morning.     Cyanocobalamin (VITAMIN B-12) 2500 MCG SUBL Take 2,500 mcg by mouth in the morning.     estrogens, conjugated, (PREMARIN) 0.625 MG tablet Take 0.3125 mg by mouth every evening.     ferrous sulfate 325 (65 FE) MG EC tablet Take 325 mg by mouth every other day.     gabapentin (NEURONTIN) 300 MG capsule Take 300 mg by mouth at bedtime as needed (restless leg syndrome).     ibuprofen (ADVIL) 200 MG  tablet Take 400 mg by mouth every 8 (eight) hours as needed (pain.).     losartan (COZAAR) 25 MG tablet Take 12.5 mg by mouth every evening.     Magnesium 250 MG TABS Take 250 mg by mouth in the morning.     Omega-3 Fatty Acids (FISH OIL) 1000 MG CAPS Take 1,000 mg by mouth in the morning.     sertraline (ZOLOFT) 50 MG tablet Take 50-75 mg by mouth every evening.     SUMAtriptan (IMITREX) 100 MG tablet Take 100 mg by mouth every 2 (two) hours as needed for migraine (max 2 doses/24hrs.). May repeat in 2 hours if headache persists or recurs.     traMADol (ULTRAM) 50 MG tablet Take 15 mg by mouth every 6 (six) hours as needed.     triamcinolone (NASACORT) 55 MCG/ACT AERO nasal inhaler Place 1 spray into the nose daily.     No current facility-administered medications for this encounter.    REVIEW OF SYSTEMS:  Notable for  that above.   PHYSICAL EXAM:  vitals were not taken for this visit.   General: Alert and oriented, in no acute distress *** HEENT: Head is normocephalic. Extraocular movements are intact. Oropharynx is clear. Neck: Neck is supple, no palpable cervical or supraclavicular lymphadenopathy. Heart: Regular in rate and rhythm with no murmurs, rubs, or gallops. Chest: Clear to auscultation bilaterally, with no rhonchi, wheezes, or rales. Abdomen: Soft, nontender, nondistended, with no rigidity or guarding. Extremities: No cyanosis or edema. Lymphatics: see Neck Exam Skin: No concerning lesions. Musculoskeletal: symmetric strength and muscle tone throughout. Neurologic: Cranial nerves II through XII are grossly intact. No obvious focalities. Speech is fluent. Coordination is intact. Psychiatric: Judgment and insight are intact. Affect is appropriate.   ECOG = ***  0 - Asymptomatic (Fully active, able to carry on all predisease activities without restriction)  1 - Symptomatic but completely ambulatory (Restricted in physically strenuous activity but ambulatory and able to carry  out work of a light or sedentary nature. For example, light housework, office work)  2 - Symptomatic, <50% in bed during the day (Ambulatory and capable of all self care but unable to carry out any work activities. Up and about more than 50% of waking hours)  3 - Symptomatic, >50% in bed, but not bedbound (Capable of only limited self-care, confined to bed or chair 50% or more of waking hours)  4 - Bedbound (Completely disabled. Cannot carry on any self-care. Totally confined to bed or chair)  5 - Death   Eustace Pen MM, Creech RH, Tormey DC, et al. [phone]). "Toxicity and response criteria of the [facility]". [place] Oncol. 5 (6): 649-55   LABORATORY DATA:  Lab Results  Component Value Date   WBC 10.6 (H) 01/08/2022   HGB 12.4 01/08/2022   HCT 38.5 01/08/2022   MCV 87.9 01/08/2022   PLT 399 01/08/2022   CMP     Component Value Date/Time   NA 136 01/08/2022 0906   K 4.1 01/08/2022 0906   CL 101 01/08/2022 0906   CO2 27 01/08/2022 0906   GLUCOSE 85 01/08/2022 0906   BUN 10 01/08/2022 0906   CREATININE 0.75 01/08/2022 0906   CALCIUM 8.9 01/08/2022 0906   GFRNONAA >60 01/08/2022 0906         RADIOGRAPHY: No results found.    IMPRESSION/PLAN:***    On date of service, in total, I spent *** minutes on this encounter. Patient was seen in person.   __________________________________________   Eppie Gibson, MD  This document serves as a record of services personally performed by Eppie Gibson, MD. It was created on her behalf by Roney Mans, a trained medical scribe. The creation of this record is based on the scribe's personal observations and the provider's statements to them. This document has been checked and approved by the attending provider.

## 2022-03-31 NOTE — Progress Notes (Incomplete)
Histology and Location of Primary Skin Cancer:  Basal cell carcinoma of chin  Past/Anticipated interventions by patient's surgeon/dermatologist for current problematic lesion, if any:  04/01/2022 --Dr. Mingo Amber (office visit) I will just make sure 1 more time that everything looks to be good before radiation starts the following week  03/18/2022 --Dr. Mingo Amber Surgical preparation for grafting of the central chin totaling 6 x 3 cm 6 x 3 cm dermal fat graft from the abdomen to the subcutaneous tissues of the chin  01/13/2022 --Dr. [name] 5x2.5 cm local tissue rearrangment of chin  12/05/2021 --Dr. [name] 3 x 4 cm local tissue rearrangement central chin, including the size of the initial defect burring of outer table of mandible bone 2.5 cm mucosal closure, intraoral and lower buccal sulcus  Past skin cancers, if any: 1) Location/Histology/Intervention: Left side of nose: Mohs  2) Location/Histology/Intervention: Both shoulders: Mohs 3) Location/Histology/Intervention: Chest: Mohs 4) Location/Histology/Intervention: ***  History of Blistering sunburns, if any: Yes, significant history of sun exposure   SAFETY ISSUES: Prior radiation? No Pacemaker/ICD? No Possible current pregnancy? No--hysterectomy Is the patient on methotrexate? No   Current Complaints / other details:  ***

## 2022-04-01 ENCOUNTER — Ambulatory Visit
Admission: RE | Admit: 2022-04-01 | Discharge: 2022-04-01 | Disposition: A | Discharge status: Home / Self Care | Payer: BC Managed Care – PPO | Source: Ambulatory Visit | Attending: Radiation Oncology | Admitting: Radiation Oncology

## 2022-04-01 ENCOUNTER — Ambulatory Visit (INDEPENDENT_AMBULATORY_CARE_PROVIDER_SITE_OTHER): Payer: BC Managed Care – PPO | Admitting: Plastic Surgery

## 2022-04-01 ENCOUNTER — Other Ambulatory Visit: Payer: Self-pay

## 2022-04-01 ENCOUNTER — Encounter: Payer: Self-pay | Admitting: Radiation Oncology

## 2022-04-01 VITALS — BP 135/68 | HR 72 | Temp 98.2°F | Resp 18 | Ht 65.0 in | Wt 120.6 lb

## 2022-04-01 DIAGNOSIS — I10 Essential (primary) hypertension: Secondary | ICD-10-CM | POA: Diagnosis not present

## 2022-04-01 DIAGNOSIS — C44319 Basal cell carcinoma of skin of other parts of face: Secondary | ICD-10-CM | POA: Insufficient documentation

## 2022-04-01 DIAGNOSIS — Z801 Family history of malignant neoplasm of trachea, bronchus and lung: Secondary | ICD-10-CM | POA: Diagnosis not present

## 2022-04-01 DIAGNOSIS — E785 Hyperlipidemia, unspecified: Secondary | ICD-10-CM | POA: Insufficient documentation

## 2022-04-01 DIAGNOSIS — F17210 Nicotine dependence, cigarettes, uncomplicated: Secondary | ICD-10-CM | POA: Diagnosis not present

## 2022-04-01 DIAGNOSIS — Z79899 Other long term (current) drug therapy: Secondary | ICD-10-CM | POA: Insufficient documentation

## 2022-04-01 NOTE — Progress Notes (Signed)
Oncology Nurse Navigator Documentation   I met with Tonya Delgado before and during her follow up new visit with Dr. Isidore Moos today. I answered her questions to the best of my ability. She then proceeded to CT simulation to plan her radiation which will start next week pending her visit with Dr. Claudia Desanctis today to assess her healing. She knows to call me if she has any questions or concerns that I can help with.   Harlow Asa RN, BSN, OCN Head & Neck Oncology Nurse [place] at [facility] Phone # [phone]  Fax # [phone]

## 2022-04-01 NOTE — Progress Notes (Signed)
Patient presents 2 weeks postop from dermal fat graft for his chin scar tethering.  She is overall very happy.  She feels like she is continue to have better freedom of movement of her lower lip and feels like the appearance is improved as well.  On exam all of her incisions look to be healing well.  There is still some residual swelling but most of the bruising is resolving.  Overall this is been positive impact on her.  She is slated to start radiation next week and I will plan to see her a month or 2 after that completed.  All of her questions were answered.

## 2022-04-01 NOTE — Progress Notes (Signed)
Histology and Location of Primary Skin Cancer:  Basal cell carcinoma of chin  Past/Anticipated interventions by patient's surgeon/dermatologist for current problematic lesion, if any:  04/01/2022 --Dr. Mingo Amber (office visit) I will just make sure 1 more time that everything looks to be good before radiation starts the following week  03/18/2022 --Dr. Mingo Amber Surgical preparation for grafting of the central chin totaling 6 x 3 cm 6 x 3 cm dermal fat graft from the abdomen to the subcutaneous tissues of the chin  01/13/2022 --Dr. [name] 5x2.5 cm local tissue rearrangment of chin  12/05/2021 --Dr. [name] 3 x 4 cm local tissue rearrangement central chin, including the size of the initial defect burring of outer table of mandible bone 2.5 cm mucosal closure, intraoral and lower buccal sulcus  Past skin cancers, if any: 1) Location/Histology/Intervention: Left side of nose: Mohs  2) Location/Histology/Intervention: Both shoulders: Mohs 3) Location/Histology/Intervention: Chest: Mohs 4) Location/Histology/Intervention:   History of Blistering sunburns, if any: Yes, history of sun exposure   SAFETY ISSUES: Prior radiation? No Pacemaker/ICD? No Possible current pregnancy? No--hysterectomy Is the patient on methotrexate? No   Current Complaints / other details:   Vitals:   04/01/22 0959  BP: 135/68  Pulse: 72  Resp: 18  Temp: 98.2 F (36.8 C)  SpO2: 100%  Weight: 54.7 kg  Height: '5\' 5"'$  (1.651 m)

## 2022-04-07 DIAGNOSIS — C44319 Basal cell carcinoma of skin of other parts of face: Secondary | ICD-10-CM | POA: Insufficient documentation

## 2022-04-08 ENCOUNTER — Ambulatory Visit: Payer: BC Managed Care – PPO

## 2022-04-08 ENCOUNTER — Ambulatory Visit
Admission: RE | Admit: 2022-04-08 | Discharge: 2022-04-08 | Disposition: A | Discharge status: Home / Self Care | Payer: BC Managed Care – PPO | Source: Ambulatory Visit | Attending: Radiation Oncology | Admitting: Radiation Oncology

## 2022-04-08 ENCOUNTER — Other Ambulatory Visit: Payer: Self-pay

## 2022-04-08 DIAGNOSIS — C44319 Basal cell carcinoma of skin of other parts of face: Secondary | ICD-10-CM | POA: Diagnosis not present

## 2022-04-08 LAB — RAD ONC ARIA SESSION SUMMARY
Course Elapsed Days: 0
Plan Fractions Treated to Date: 1
Plan Prescribed Dose Per Fraction: 2 Gy
Plan Total Fractions Prescribed: 30
Plan Total Prescribed Dose: 60 Gy
Reference Point Dosage Given to Date: 2 Gy
Reference Point Session Dosage Given: 2 Gy
Session Number: 1

## 2022-04-08 MED ORDER — SONAFINE EX EMUL
1.0000 "application " | Freq: Two times a day (BID) | CUTANEOUS | Status: DC
  Administered 2022-04-08: 1 via TOPICAL

## 2022-04-08 NOTE — Progress Notes (Signed)
Pt here for patient teaching.    Pt given Radiation and You booklet, skin care instructions, and Sonafine.    Reviewed areas of pertinence such as fatigue, hair loss in treatment field, mouth changes, skin changes, throat changes, and taste changes .   Pt able to give teach back of to pat skin, use unscented/gentle soap, and drink plenty of water,apply Sonafine bid and avoid applying anything to skin within 4 hours of treatment.   Pt demonstrated understanding and verbalizes understanding of information given and will contact nursing with any questions or concerns.    [email or web address]

## 2022-04-08 NOTE — Progress Notes (Signed)
Oncology Nurse Navigator Documentation   To provide support, encouragement and care continuity, met with Ms. Godina and her husband after her initial RT.    Ms. Kipnis completed treatment without difficulty, denied questions/concerns. I walked her around for her weekly visit with Dr. Isidore Moos.  I reviewed the registration/arrival procedure for subsequent treatments. I encouraged them to call me with questions/concerns as tmts proceed.   Harlow Asa RN, BSN, OCN Head & Neck Oncology Nurse [place] at [facility] Phone # [phone]  Fax # [phone]

## 2022-04-09 ENCOUNTER — Other Ambulatory Visit: Payer: Self-pay

## 2022-04-09 ENCOUNTER — Ambulatory Visit
Admission: RE | Admit: 2022-04-09 | Discharge: 2022-04-09 | Disposition: A | Discharge status: Home / Self Care | Payer: BC Managed Care – PPO | Source: Ambulatory Visit | Attending: Radiation Oncology | Admitting: Radiation Oncology

## 2022-04-09 DIAGNOSIS — C44319 Basal cell carcinoma of skin of other parts of face: Secondary | ICD-10-CM | POA: Diagnosis not present

## 2022-04-09 LAB — RAD ONC ARIA SESSION SUMMARY
Course Elapsed Days: 1
Plan Fractions Treated to Date: 2
Plan Prescribed Dose Per Fraction: 2 Gy
Plan Total Fractions Prescribed: 30
Plan Total Prescribed Dose: 60 Gy
Reference Point Dosage Given to Date: 4 Gy
Reference Point Session Dosage Given: 2 Gy
Session Number: 2

## 2022-04-10 ENCOUNTER — Other Ambulatory Visit: Payer: Self-pay

## 2022-04-10 ENCOUNTER — Ambulatory Visit
Admission: RE | Admit: 2022-04-10 | Discharge: 2022-04-10 | Disposition: A | Discharge status: Home / Self Care | Payer: BC Managed Care – PPO | Source: Ambulatory Visit | Attending: Radiation Oncology | Admitting: Radiation Oncology

## 2022-04-10 DIAGNOSIS — C44319 Basal cell carcinoma of skin of other parts of face: Secondary | ICD-10-CM | POA: Diagnosis not present

## 2022-04-10 LAB — RAD ONC ARIA SESSION SUMMARY
Course Elapsed Days: 2
Plan Fractions Treated to Date: 3
Plan Prescribed Dose Per Fraction: 2 Gy
Plan Total Fractions Prescribed: 30
Plan Total Prescribed Dose: 60 Gy
Reference Point Dosage Given to Date: 6 Gy
Reference Point Session Dosage Given: 2 Gy
Session Number: 3

## 2022-04-13 ENCOUNTER — Ambulatory Visit
Admission: RE | Admit: 2022-04-13 | Discharge: 2022-04-13 | Disposition: A | Discharge status: Home / Self Care | Payer: BC Managed Care – PPO | Source: Ambulatory Visit | Attending: Radiation Oncology | Admitting: Radiation Oncology

## 2022-04-13 ENCOUNTER — Other Ambulatory Visit: Payer: Self-pay

## 2022-04-13 DIAGNOSIS — C44319 Basal cell carcinoma of skin of other parts of face: Secondary | ICD-10-CM | POA: Diagnosis not present

## 2022-04-13 LAB — RAD ONC ARIA SESSION SUMMARY
Course Elapsed Days: 5
Plan Fractions Treated to Date: 4
Plan Prescribed Dose Per Fraction: 2 Gy
Plan Total Fractions Prescribed: 30
Plan Total Prescribed Dose: 60 Gy
Reference Point Dosage Given to Date: 8 Gy
Reference Point Session Dosage Given: 2 Gy
Session Number: 4

## 2022-04-14 ENCOUNTER — Other Ambulatory Visit: Payer: Self-pay

## 2022-04-14 ENCOUNTER — Ambulatory Visit
Admission: RE | Admit: 2022-04-14 | Discharge: 2022-04-14 | Disposition: A | Discharge status: Home / Self Care | Payer: BC Managed Care – PPO | Source: Ambulatory Visit | Attending: Radiation Oncology | Admitting: Radiation Oncology

## 2022-04-14 DIAGNOSIS — C44319 Basal cell carcinoma of skin of other parts of face: Secondary | ICD-10-CM | POA: Diagnosis not present

## 2022-04-14 LAB — RAD ONC ARIA SESSION SUMMARY
Course Elapsed Days: 6
Plan Fractions Treated to Date: 5
Plan Prescribed Dose Per Fraction: 2 Gy
Plan Total Fractions Prescribed: 30
Plan Total Prescribed Dose: 60 Gy
Reference Point Dosage Given to Date: 10 Gy
Reference Point Session Dosage Given: 2 Gy
Session Number: 5

## 2022-04-15 ENCOUNTER — Other Ambulatory Visit: Payer: Self-pay

## 2022-04-15 ENCOUNTER — Ambulatory Visit
Admission: RE | Admit: 2022-04-15 | Discharge: 2022-04-15 | Disposition: A | Discharge status: Home / Self Care | Payer: BC Managed Care – PPO | Source: Ambulatory Visit | Attending: Radiation Oncology | Admitting: Radiation Oncology

## 2022-04-15 DIAGNOSIS — C44319 Basal cell carcinoma of skin of other parts of face: Secondary | ICD-10-CM | POA: Diagnosis not present

## 2022-04-15 LAB — RAD ONC ARIA SESSION SUMMARY
Course Elapsed Days: 7
Plan Fractions Treated to Date: 6
Plan Prescribed Dose Per Fraction: 2 Gy
Plan Total Fractions Prescribed: 30
Plan Total Prescribed Dose: 60 Gy
Reference Point Dosage Given to Date: 12 Gy
Reference Point Session Dosage Given: 2 Gy
Session Number: 6

## 2022-04-16 ENCOUNTER — Ambulatory Visit
Admission: RE | Admit: 2022-04-16 | Discharge: 2022-04-16 | Disposition: A | Discharge status: Home / Self Care | Payer: BC Managed Care – PPO | Source: Ambulatory Visit | Attending: Radiation Oncology | Admitting: Radiation Oncology

## 2022-04-16 ENCOUNTER — Other Ambulatory Visit: Payer: Self-pay

## 2022-04-16 DIAGNOSIS — C44319 Basal cell carcinoma of skin of other parts of face: Secondary | ICD-10-CM | POA: Diagnosis not present

## 2022-04-16 LAB — RAD ONC ARIA SESSION SUMMARY
Course Elapsed Days: 8
Plan Fractions Treated to Date: 7
Plan Prescribed Dose Per Fraction: 2 Gy
Plan Total Fractions Prescribed: 30
Plan Total Prescribed Dose: 60 Gy
Reference Point Dosage Given to Date: 14 Gy
Reference Point Session Dosage Given: 2 Gy
Session Number: 7

## 2022-04-17 ENCOUNTER — Other Ambulatory Visit: Payer: Self-pay

## 2022-04-17 ENCOUNTER — Ambulatory Visit
Admission: RE | Admit: 2022-04-17 | Discharge: 2022-04-17 | Disposition: A | Discharge status: Home / Self Care | Payer: BC Managed Care – PPO | Source: Ambulatory Visit | Attending: Radiation Oncology | Admitting: Radiation Oncology

## 2022-04-17 DIAGNOSIS — C44319 Basal cell carcinoma of skin of other parts of face: Secondary | ICD-10-CM | POA: Diagnosis not present

## 2022-04-17 LAB — RAD ONC ARIA SESSION SUMMARY
Course Elapsed Days: 9
Plan Fractions Treated to Date: 8
Plan Prescribed Dose Per Fraction: 2 Gy
Plan Total Fractions Prescribed: 30
Plan Total Prescribed Dose: 60 Gy
Reference Point Dosage Given to Date: 16 Gy
Reference Point Session Dosage Given: 2 Gy
Session Number: 8

## 2022-04-20 ENCOUNTER — Other Ambulatory Visit: Payer: Self-pay

## 2022-04-20 ENCOUNTER — Ambulatory Visit
Admission: RE | Admit: 2022-04-20 | Discharge: 2022-04-20 | Disposition: A | Discharge status: Home / Self Care | Payer: BC Managed Care – PPO | Source: Ambulatory Visit | Attending: Radiation Oncology | Admitting: Radiation Oncology

## 2022-04-20 DIAGNOSIS — C44319 Basal cell carcinoma of skin of other parts of face: Secondary | ICD-10-CM | POA: Diagnosis not present

## 2022-04-20 LAB — RAD ONC ARIA SESSION SUMMARY
Course Elapsed Days: 12
Plan Fractions Treated to Date: 9
Plan Prescribed Dose Per Fraction: 2 Gy
Plan Total Fractions Prescribed: 30
Plan Total Prescribed Dose: 60 Gy
Reference Point Dosage Given to Date: 18 Gy
Reference Point Session Dosage Given: 2 Gy
Session Number: 9

## 2022-04-21 ENCOUNTER — Ambulatory Visit
Admission: RE | Admit: 2022-04-21 | Discharge: 2022-04-21 | Disposition: A | Discharge status: Home / Self Care | Payer: BC Managed Care – PPO | Source: Ambulatory Visit | Attending: Radiation Oncology | Admitting: Radiation Oncology

## 2022-04-21 ENCOUNTER — Other Ambulatory Visit: Payer: Self-pay

## 2022-04-21 DIAGNOSIS — C44319 Basal cell carcinoma of skin of other parts of face: Secondary | ICD-10-CM | POA: Diagnosis not present

## 2022-04-21 LAB — RAD ONC ARIA SESSION SUMMARY
Course Elapsed Days: 13
Plan Fractions Treated to Date: 10
Plan Prescribed Dose Per Fraction: 2 Gy
Plan Total Fractions Prescribed: 30
Plan Total Prescribed Dose: 60 Gy
Reference Point Dosage Given to Date: 20 Gy
Reference Point Session Dosage Given: 2 Gy
Session Number: 10

## 2022-04-22 ENCOUNTER — Other Ambulatory Visit: Payer: Self-pay

## 2022-04-22 ENCOUNTER — Ambulatory Visit
Admission: RE | Admit: 2022-04-22 | Discharge: 2022-04-22 | Disposition: A | Discharge status: Home / Self Care | Payer: BC Managed Care – PPO | Source: Ambulatory Visit | Attending: Radiation Oncology | Admitting: Radiation Oncology

## 2022-04-22 DIAGNOSIS — C44319 Basal cell carcinoma of skin of other parts of face: Secondary | ICD-10-CM | POA: Diagnosis not present

## 2022-04-22 LAB — RAD ONC ARIA SESSION SUMMARY
Course Elapsed Days: 14
Plan Fractions Treated to Date: 11
Plan Prescribed Dose Per Fraction: 2 Gy
Plan Total Fractions Prescribed: 30
Plan Total Prescribed Dose: 60 Gy
Reference Point Dosage Given to Date: 22 Gy
Reference Point Session Dosage Given: 2 Gy
Session Number: 11

## 2022-04-23 ENCOUNTER — Ambulatory Visit
Admission: RE | Admit: 2022-04-23 | Discharge: 2022-04-23 | Disposition: A | Discharge status: Home / Self Care | Payer: BC Managed Care – PPO | Source: Ambulatory Visit | Attending: Radiation Oncology | Admitting: Radiation Oncology

## 2022-04-23 ENCOUNTER — Other Ambulatory Visit: Payer: Self-pay

## 2022-04-23 DIAGNOSIS — C44319 Basal cell carcinoma of skin of other parts of face: Secondary | ICD-10-CM | POA: Diagnosis not present

## 2022-04-23 LAB — RAD ONC ARIA SESSION SUMMARY
Course Elapsed Days: 15
Plan Fractions Treated to Date: 12
Plan Prescribed Dose Per Fraction: 2 Gy
Plan Total Fractions Prescribed: 30
Plan Total Prescribed Dose: 60 Gy
Reference Point Dosage Given to Date: 24 Gy
Reference Point Session Dosage Given: 2 Gy
Session Number: 12

## 2022-04-24 ENCOUNTER — Ambulatory Visit
Admission: RE | Admit: 2022-04-24 | Discharge: 2022-04-24 | Disposition: A | Discharge status: Home / Self Care | Payer: BC Managed Care – PPO | Source: Ambulatory Visit | Attending: Radiation Oncology | Admitting: Radiation Oncology

## 2022-04-24 ENCOUNTER — Other Ambulatory Visit: Payer: Self-pay

## 2022-04-24 DIAGNOSIS — C44319 Basal cell carcinoma of skin of other parts of face: Secondary | ICD-10-CM | POA: Diagnosis not present

## 2022-04-24 LAB — RAD ONC ARIA SESSION SUMMARY
Course Elapsed Days: 16
Plan Fractions Treated to Date: 13
Plan Prescribed Dose Per Fraction: 2 Gy
Plan Total Fractions Prescribed: 30
Plan Total Prescribed Dose: 60 Gy
Reference Point Dosage Given to Date: 26 Gy
Reference Point Session Dosage Given: 2 Gy
Session Number: 13

## 2022-04-27 ENCOUNTER — Other Ambulatory Visit: Payer: Self-pay

## 2022-04-27 ENCOUNTER — Ambulatory Visit
Admission: RE | Admit: 2022-04-27 | Discharge: 2022-04-27 | Disposition: A | Discharge status: Home / Self Care | Payer: BC Managed Care – PPO | Source: Ambulatory Visit | Attending: Radiation Oncology | Admitting: Radiation Oncology

## 2022-04-27 DIAGNOSIS — C44319 Basal cell carcinoma of skin of other parts of face: Secondary | ICD-10-CM | POA: Diagnosis not present

## 2022-04-27 LAB — RAD ONC ARIA SESSION SUMMARY
Course Elapsed Days: 19
Plan Fractions Treated to Date: 14
Plan Prescribed Dose Per Fraction: 2 Gy
Plan Total Fractions Prescribed: 30
Plan Total Prescribed Dose: 60 Gy
Reference Point Dosage Given to Date: 28 Gy
Reference Point Session Dosage Given: 2 Gy
Session Number: 14

## 2022-04-28 ENCOUNTER — Ambulatory Visit
Admission: RE | Admit: 2022-04-28 | Discharge: 2022-04-28 | Disposition: A | Discharge status: Home / Self Care | Payer: BC Managed Care – PPO | Source: Ambulatory Visit | Attending: Radiation Oncology | Admitting: Radiation Oncology

## 2022-04-28 ENCOUNTER — Other Ambulatory Visit: Payer: Self-pay

## 2022-04-28 DIAGNOSIS — C44319 Basal cell carcinoma of skin of other parts of face: Secondary | ICD-10-CM | POA: Diagnosis not present

## 2022-04-28 LAB — RAD ONC ARIA SESSION SUMMARY
Course Elapsed Days: 20
Plan Fractions Treated to Date: 15
Plan Prescribed Dose Per Fraction: 2 Gy
Plan Total Fractions Prescribed: 30
Plan Total Prescribed Dose: 60 Gy
Reference Point Dosage Given to Date: 30 Gy
Reference Point Session Dosage Given: 2 Gy
Session Number: 15

## 2022-04-29 ENCOUNTER — Other Ambulatory Visit: Payer: Self-pay

## 2022-04-29 ENCOUNTER — Ambulatory Visit
Admission: RE | Admit: 2022-04-29 | Discharge: 2022-04-29 | Disposition: A | Discharge status: Home / Self Care | Payer: BC Managed Care – PPO | Source: Ambulatory Visit | Attending: Radiation Oncology | Admitting: Radiation Oncology

## 2022-04-29 DIAGNOSIS — C44319 Basal cell carcinoma of skin of other parts of face: Secondary | ICD-10-CM | POA: Diagnosis not present

## 2022-04-29 LAB — RAD ONC ARIA SESSION SUMMARY
Course Elapsed Days: 21
Plan Fractions Treated to Date: 16
Plan Prescribed Dose Per Fraction: 2 Gy
Plan Total Fractions Prescribed: 30
Plan Total Prescribed Dose: 60 Gy
Reference Point Dosage Given to Date: 32 Gy
Reference Point Session Dosage Given: 2 Gy
Session Number: 16

## 2022-04-30 ENCOUNTER — Ambulatory Visit
Admission: RE | Admit: 2022-04-30 | Discharge: 2022-04-30 | Disposition: A | Discharge status: Home / Self Care | Payer: BC Managed Care – PPO | Source: Ambulatory Visit | Attending: Radiation Oncology | Admitting: Radiation Oncology

## 2022-04-30 ENCOUNTER — Other Ambulatory Visit: Payer: Self-pay

## 2022-04-30 DIAGNOSIS — C44319 Basal cell carcinoma of skin of other parts of face: Secondary | ICD-10-CM | POA: Diagnosis not present

## 2022-04-30 LAB — RAD ONC ARIA SESSION SUMMARY
Course Elapsed Days: 22
Plan Fractions Treated to Date: 17
Plan Prescribed Dose Per Fraction: 2 Gy
Plan Total Fractions Prescribed: 30
Plan Total Prescribed Dose: 60 Gy
Reference Point Dosage Given to Date: 34 Gy
Reference Point Session Dosage Given: 2 Gy
Session Number: 17

## 2022-05-01 ENCOUNTER — Other Ambulatory Visit: Payer: Self-pay

## 2022-05-01 ENCOUNTER — Ambulatory Visit
Admission: RE | Admit: 2022-05-01 | Discharge: 2022-05-01 | Disposition: A | Discharge status: Home / Self Care | Payer: BC Managed Care – PPO | Source: Ambulatory Visit | Attending: Radiation Oncology | Admitting: Radiation Oncology

## 2022-05-01 DIAGNOSIS — C44319 Basal cell carcinoma of skin of other parts of face: Secondary | ICD-10-CM | POA: Diagnosis not present

## 2022-05-01 LAB — RAD ONC ARIA SESSION SUMMARY
Course Elapsed Days: 23
Plan Fractions Treated to Date: 18
Plan Prescribed Dose Per Fraction: 2 Gy
Plan Total Fractions Prescribed: 30
Plan Total Prescribed Dose: 60 Gy
Reference Point Dosage Given to Date: 36 Gy
Reference Point Session Dosage Given: 2 Gy
Session Number: 18

## 2022-05-04 ENCOUNTER — Other Ambulatory Visit: Payer: Self-pay

## 2022-05-04 ENCOUNTER — Ambulatory Visit: Payer: BC Managed Care – PPO

## 2022-05-04 ENCOUNTER — Ambulatory Visit
Admission: RE | Admit: 2022-05-04 | Discharge: 2022-05-04 | Disposition: A | Discharge status: Home / Self Care | Payer: BC Managed Care – PPO | Source: Ambulatory Visit | Attending: Radiation Oncology | Admitting: Radiation Oncology

## 2022-05-04 DIAGNOSIS — C44319 Basal cell carcinoma of skin of other parts of face: Secondary | ICD-10-CM | POA: Insufficient documentation

## 2022-05-04 LAB — RAD ONC ARIA SESSION SUMMARY
Course Elapsed Days: 26
Plan Fractions Treated to Date: 19
Plan Prescribed Dose Per Fraction: 2 Gy
Plan Total Fractions Prescribed: 30
Plan Total Prescribed Dose: 60 Gy
Reference Point Dosage Given to Date: 38 Gy
Reference Point Session Dosage Given: 2 Gy
Session Number: 19

## 2022-05-04 MED ORDER — SONAFINE EX EMUL
1.0000 | Freq: Once | CUTANEOUS | Status: AC
  Administered 2022-05-04: 1 via TOPICAL

## 2022-05-06 ENCOUNTER — Other Ambulatory Visit: Payer: Self-pay

## 2022-05-06 ENCOUNTER — Ambulatory Visit
Admission: RE | Admit: 2022-05-06 | Discharge: 2022-05-06 | Disposition: A | Discharge status: Home / Self Care | Payer: BC Managed Care – PPO | Source: Ambulatory Visit | Attending: Radiation Oncology | Admitting: Radiation Oncology

## 2022-05-06 DIAGNOSIS — C44319 Basal cell carcinoma of skin of other parts of face: Secondary | ICD-10-CM | POA: Diagnosis not present

## 2022-05-06 LAB — RAD ONC ARIA SESSION SUMMARY
Course Elapsed Days: 28
Plan Fractions Treated to Date: 20
Plan Prescribed Dose Per Fraction: 2 Gy
Plan Total Fractions Prescribed: 30
Plan Total Prescribed Dose: 60 Gy
Reference Point Dosage Given to Date: 40 Gy
Reference Point Session Dosage Given: 2 Gy
Session Number: 20

## 2022-05-07 ENCOUNTER — Other Ambulatory Visit: Payer: Self-pay

## 2022-05-07 ENCOUNTER — Ambulatory Visit
Admission: RE | Admit: 2022-05-07 | Discharge: 2022-05-07 | Disposition: A | Discharge status: Home / Self Care | Payer: BC Managed Care – PPO | Source: Ambulatory Visit | Attending: Radiation Oncology | Admitting: Radiation Oncology

## 2022-05-07 DIAGNOSIS — C44319 Basal cell carcinoma of skin of other parts of face: Secondary | ICD-10-CM | POA: Diagnosis not present

## 2022-05-07 LAB — RAD ONC ARIA SESSION SUMMARY
Course Elapsed Days: 29
Plan Fractions Treated to Date: 21
Plan Prescribed Dose Per Fraction: 2 Gy
Plan Total Fractions Prescribed: 30
Plan Total Prescribed Dose: 60 Gy
Reference Point Dosage Given to Date: 42 Gy
Reference Point Session Dosage Given: 2 Gy
Session Number: 21

## 2022-05-08 ENCOUNTER — Ambulatory Visit
Admission: RE | Admit: 2022-05-08 | Discharge: 2022-05-08 | Disposition: A | Discharge status: Home / Self Care | Payer: BC Managed Care – PPO | Source: Ambulatory Visit | Attending: Radiation Oncology | Admitting: Radiation Oncology

## 2022-05-08 ENCOUNTER — Other Ambulatory Visit: Payer: Self-pay

## 2022-05-08 DIAGNOSIS — C44319 Basal cell carcinoma of skin of other parts of face: Secondary | ICD-10-CM | POA: Diagnosis not present

## 2022-05-08 LAB — RAD ONC ARIA SESSION SUMMARY
Course Elapsed Days: 30
Plan Fractions Treated to Date: 22
Plan Prescribed Dose Per Fraction: 2 Gy
Plan Total Fractions Prescribed: 30
Plan Total Prescribed Dose: 60 Gy
Reference Point Dosage Given to Date: 44 Gy
Reference Point Session Dosage Given: 2 Gy
Session Number: 22

## 2022-05-11 ENCOUNTER — Other Ambulatory Visit: Payer: Self-pay

## 2022-05-11 ENCOUNTER — Ambulatory Visit
Admission: RE | Admit: 2022-05-11 | Discharge: 2022-05-11 | Disposition: A | Discharge status: Home / Self Care | Payer: BC Managed Care – PPO | Source: Ambulatory Visit | Attending: Radiation Oncology | Admitting: Radiation Oncology

## 2022-05-11 DIAGNOSIS — C44319 Basal cell carcinoma of skin of other parts of face: Secondary | ICD-10-CM | POA: Diagnosis not present

## 2022-05-11 LAB — RAD ONC ARIA SESSION SUMMARY
Course Elapsed Days: 33
Plan Fractions Treated to Date: 23
Plan Prescribed Dose Per Fraction: 2 Gy
Plan Total Fractions Prescribed: 30
Plan Total Prescribed Dose: 60 Gy
Reference Point Dosage Given to Date: 46 Gy
Reference Point Session Dosage Given: 2 Gy
Session Number: 23

## 2022-05-12 ENCOUNTER — Ambulatory Visit
Admission: RE | Admit: 2022-05-12 | Discharge: 2022-05-12 | Disposition: A | Discharge status: Home / Self Care | Payer: BC Managed Care – PPO | Source: Ambulatory Visit | Attending: Radiation Oncology | Admitting: Radiation Oncology

## 2022-05-12 ENCOUNTER — Ambulatory Visit: Payer: BC Managed Care – PPO

## 2022-05-12 ENCOUNTER — Other Ambulatory Visit: Payer: Self-pay

## 2022-05-12 DIAGNOSIS — C44319 Basal cell carcinoma of skin of other parts of face: Secondary | ICD-10-CM | POA: Diagnosis not present

## 2022-05-12 LAB — RAD ONC ARIA SESSION SUMMARY
Course Elapsed Days: 34
Plan Fractions Treated to Date: 24
Plan Prescribed Dose Per Fraction: 2 Gy
Plan Total Fractions Prescribed: 30
Plan Total Prescribed Dose: 60 Gy
Reference Point Dosage Given to Date: 48 Gy
Reference Point Session Dosage Given: 2 Gy
Session Number: 24

## 2022-05-13 ENCOUNTER — Other Ambulatory Visit: Payer: Self-pay

## 2022-05-13 ENCOUNTER — Ambulatory Visit
Admission: RE | Admit: 2022-05-13 | Discharge: 2022-05-13 | Disposition: A | Discharge status: Home / Self Care | Payer: BC Managed Care – PPO | Source: Ambulatory Visit | Attending: Radiation Oncology | Admitting: Radiation Oncology

## 2022-05-13 DIAGNOSIS — C44319 Basal cell carcinoma of skin of other parts of face: Secondary | ICD-10-CM | POA: Diagnosis not present

## 2022-05-13 LAB — RAD ONC ARIA SESSION SUMMARY
Course Elapsed Days: 35
Plan Fractions Treated to Date: 25
Plan Prescribed Dose Per Fraction: 2 Gy
Plan Total Fractions Prescribed: 30
Plan Total Prescribed Dose: 60 Gy
Reference Point Dosage Given to Date: 50 Gy
Reference Point Session Dosage Given: 2 Gy
Session Number: 25

## 2022-05-14 ENCOUNTER — Other Ambulatory Visit: Payer: Self-pay

## 2022-05-14 ENCOUNTER — Ambulatory Visit
Admission: RE | Admit: 2022-05-14 | Discharge: 2022-05-14 | Disposition: A | Discharge status: Home / Self Care | Payer: BC Managed Care – PPO | Source: Ambulatory Visit | Attending: Radiation Oncology | Admitting: Radiation Oncology

## 2022-05-14 DIAGNOSIS — C44319 Basal cell carcinoma of skin of other parts of face: Secondary | ICD-10-CM | POA: Diagnosis not present

## 2022-05-14 LAB — RAD ONC ARIA SESSION SUMMARY
Course Elapsed Days: 36
Plan Fractions Treated to Date: 26
Plan Prescribed Dose Per Fraction: 2 Gy
Plan Total Fractions Prescribed: 30
Plan Total Prescribed Dose: 60 Gy
Reference Point Dosage Given to Date: 52 Gy
Reference Point Session Dosage Given: 2 Gy
Session Number: 26

## 2022-05-15 ENCOUNTER — Ambulatory Visit
Admission: RE | Admit: 2022-05-15 | Discharge: 2022-05-15 | Disposition: A | Discharge status: Home / Self Care | Payer: BC Managed Care – PPO | Source: Ambulatory Visit | Attending: Radiation Oncology | Admitting: Radiation Oncology

## 2022-05-15 ENCOUNTER — Other Ambulatory Visit: Payer: Self-pay

## 2022-05-15 DIAGNOSIS — C44319 Basal cell carcinoma of skin of other parts of face: Secondary | ICD-10-CM | POA: Diagnosis not present

## 2022-05-15 LAB — RAD ONC ARIA SESSION SUMMARY
Course Elapsed Days: 37
Plan Fractions Treated to Date: 27
Plan Prescribed Dose Per Fraction: 2 Gy
Plan Total Fractions Prescribed: 30
Plan Total Prescribed Dose: 60 Gy
Reference Point Dosage Given to Date: 54 Gy
Reference Point Session Dosage Given: 2 Gy
Session Number: 27

## 2022-05-18 ENCOUNTER — Other Ambulatory Visit: Payer: Self-pay

## 2022-05-18 ENCOUNTER — Other Ambulatory Visit: Payer: Self-pay | Admitting: Radiation Oncology

## 2022-05-18 ENCOUNTER — Ambulatory Visit
Admission: RE | Admit: 2022-05-18 | Discharge: 2022-05-18 | Disposition: A | Discharge status: Home / Self Care | Payer: BC Managed Care – PPO | Source: Ambulatory Visit | Attending: Radiation Oncology | Admitting: Radiation Oncology

## 2022-05-18 ENCOUNTER — Other Ambulatory Visit: Payer: Self-pay | Admitting: Family Medicine

## 2022-05-18 DIAGNOSIS — C44319 Basal cell carcinoma of skin of other parts of face: Secondary | ICD-10-CM | POA: Diagnosis not present

## 2022-05-18 DIAGNOSIS — F17200 Nicotine dependence, unspecified, uncomplicated: Secondary | ICD-10-CM

## 2022-05-18 LAB — RAD ONC ARIA SESSION SUMMARY
Course Elapsed Days: 40
Plan Fractions Treated to Date: 28
Plan Prescribed Dose Per Fraction: 2 Gy
Plan Total Fractions Prescribed: 30
Plan Total Prescribed Dose: 60 Gy
Reference Point Dosage Given to Date: 56 Gy
Reference Point Session Dosage Given: 2 Gy
Session Number: 28

## 2022-05-18 MED ORDER — LIDOCAINE VISCOUS HCL 2 % MT SOLN: OROMUCOSAL | 1 refills | Status: DC

## 2022-05-19 ENCOUNTER — Other Ambulatory Visit: Payer: Self-pay

## 2022-05-19 ENCOUNTER — Ambulatory Visit
Admission: RE | Admit: 2022-05-19 | Discharge: 2022-05-19 | Disposition: A | Discharge status: Home / Self Care | Payer: BC Managed Care – PPO | Source: Ambulatory Visit | Attending: Radiation Oncology | Admitting: Radiation Oncology

## 2022-05-19 DIAGNOSIS — C44319 Basal cell carcinoma of skin of other parts of face: Secondary | ICD-10-CM | POA: Diagnosis not present

## 2022-05-19 LAB — RAD ONC ARIA SESSION SUMMARY
Course Elapsed Days: 41
Plan Fractions Treated to Date: 29
Plan Prescribed Dose Per Fraction: 2 Gy
Plan Total Fractions Prescribed: 30
Plan Total Prescribed Dose: 60 Gy
Reference Point Dosage Given to Date: 58 Gy
Reference Point Session Dosage Given: 2 Gy
Session Number: 29

## 2022-05-20 ENCOUNTER — Other Ambulatory Visit: Payer: Self-pay

## 2022-05-20 ENCOUNTER — Ambulatory Visit
Admission: RE | Admit: 2022-05-20 | Discharge: 2022-05-20 | Disposition: A | Discharge status: Home / Self Care | Payer: BC Managed Care – PPO | Source: Ambulatory Visit | Attending: Radiation Oncology | Admitting: Radiation Oncology

## 2022-05-20 ENCOUNTER — Encounter: Payer: Self-pay | Admitting: Radiation Oncology

## 2022-05-20 DIAGNOSIS — C44319 Basal cell carcinoma of skin of other parts of face: Secondary | ICD-10-CM | POA: Diagnosis not present

## 2022-05-20 LAB — RAD ONC ARIA SESSION SUMMARY
Course Elapsed Days: 42
Plan Fractions Treated to Date: 30
Plan Prescribed Dose Per Fraction: 2 Gy
Plan Total Fractions Prescribed: 30
Plan Total Prescribed Dose: 60 Gy
Reference Point Dosage Given to Date: 60 Gy
Reference Point Session Dosage Given: 2 Gy
Session Number: 30

## 2022-05-20 NOTE — Progress Notes (Signed)
Oncology Nurse Navigator Documentation   Met with Ms. Bevan after final RT to offer support and to celebrate end of radiation treatment.   Provided verbal post-RT guidance: Importance of keeping all follow-up appts with Dr. Isidore Moos. Importance of protecting treatment area from sun. Continuation of Sonafine application 2-3 times daily, application of antibiotic ointment to areas of raw skin; when supply of Sonafine exhausted transition to OTC lotion with vitamin E.  Explained my role as navigator will continue for several more months, encouraged her to call me with needs/concerns.    Harlow Asa RN, BSN, OCN Head & Neck Oncology Nurse [place] at [facility] Phone # [phone]  Fax # [phone]

## 2022-06-08 ENCOUNTER — Ambulatory Visit
Admission: RE | Admit: 2022-06-08 | Discharge: 2022-06-08 | Disposition: A | Discharge status: Home / Self Care | Payer: BC Managed Care – PPO | Source: Ambulatory Visit | Attending: Family Medicine | Admitting: Family Medicine

## 2022-06-08 DIAGNOSIS — F17200 Nicotine dependence, unspecified, uncomplicated: Secondary | ICD-10-CM

## 2022-06-24 ENCOUNTER — Ambulatory Visit: Payer: Self-pay | Admitting: Radiation Oncology

## 2022-06-24 NOTE — Progress Notes (Signed)
                                                                                                                                                             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   Plan: The patient will follow-up with radiation oncology in 49mo -----------------------------------  SEppie Gibson MD

## 2022-06-26 ENCOUNTER — Encounter: Payer: Self-pay | Admitting: Radiation Oncology

## 2022-06-26 ENCOUNTER — Ambulatory Visit
Admission: RE | Admit: 2022-06-26 | Discharge: 2022-06-26 | Disposition: A | Discharge status: Home / Self Care | Payer: BC Managed Care – PPO | Source: Ambulatory Visit | Attending: Radiation Oncology | Admitting: Radiation Oncology

## 2022-06-26 ENCOUNTER — Other Ambulatory Visit: Payer: Self-pay

## 2022-06-26 DIAGNOSIS — Z79899 Other long term (current) drug therapy: Secondary | ICD-10-CM | POA: Insufficient documentation

## 2022-06-26 DIAGNOSIS — Z923 Personal history of irradiation: Secondary | ICD-10-CM | POA: Insufficient documentation

## 2022-06-26 DIAGNOSIS — J432 Centrilobular emphysema: Secondary | ICD-10-CM | POA: Insufficient documentation

## 2022-06-26 DIAGNOSIS — C44319 Basal cell carcinoma of skin of other parts of face: Secondary | ICD-10-CM | POA: Insufficient documentation

## 2022-06-26 DIAGNOSIS — I700 Atherosclerosis of aorta: Secondary | ICD-10-CM | POA: Diagnosis not present

## 2022-06-26 NOTE — Progress Notes (Signed)
Tonya Delgado is here for follow up after treatment to her face/chin which finished on 05/20/2022.  She reports having pain in her bottom lip because she had a biopsy done by Dr. Anabel Bene at [facility] on Monday.  She is wondering if she can use her lidocaine left over from treatment to help with the pain.  She denies having any mouth pain.  She reports her skin and tissue on her chin feels tight.  She reports her fatigue is better but she still does not feel back to normal.  The skin on her lower face is intact.  She is using Sonafine and Aquaphor.  She is wondering if she can get a facial.  She was notified that Dr. Claudia Desanctis is no longer with [facility].  She is wondering where he is now and how she should follow up with plastic surgery.  BP 133/77 (BP Location: Right Arm, Patient Position: Sitting, Cuff Size: Normal)   Pulse 76   Temp 97.8 F (36.6 C)   Resp 18   Ht '5\' 5"'$  (1.651 m)   Wt 120 lb (54.4 kg)   SpO2 98%   BMI 19.97 kg/m

## 2022-06-26 NOTE — Progress Notes (Addendum)
Radiation Oncology         [phone] ________________________________  Name: Tonya Delgado MRN: [id number]  Date: 06/26/2022  DOB: 02-03-1956  Follow-Up Visit Note  Outpatient  CC: Marda Stalker, Meda Klinefelter, MD  Diagnosis and Prior Radiotherapy:    ICD-10-CM   1. Basal cell carcinoma of chin  C44.319       Radiation Treatment Dates: 04/08/2022 through 05/20/2022 Site Technique Total Dose (Gy) Dose per Fx (Gy) Completed Fx Beam Energies  Face: HN_chin IMRT 60/60 2 30/30 6X   CHIEF COMPLAINT: Here for follow-up and surveillance of chin cancer  Narrative:  The patient returns today for routine follow-up.  Tonya Delgado is here for follow up after treatment to her face/chin which finished on 05/20/2022.  She reports having pain in her bottom lip because she had a biopsy done by Dr. Anabel Bene at [facility] on Monday.  She is wondering if she can use her lidocaine left over from treatment to help with the pain.  She denies having any mouth pain.  She reports her skin and tissue on her chin feels tight.  She reports her fatigue is better but she still does not feel back to normal.  The skin on her lower face is intact.  She is using Sonafine and Aquaphor.  She is wondering if she can get a facial.  She was notified that Dr. Claudia Desanctis is no longer with [facility].  She is wondering where he is now and how she should follow up with plastic surgery.  BP 133/77 (BP Location: Right Arm, Patient Position: Sitting, Cuff Size: Normal)   Pulse 76   Temp 97.8 F (36.6 C)   Resp 18   Ht '5\' 5"'$  (1.651 m)   Wt 120 lb (54.4 kg)   SpO2 98%   BMI 19.97 kg/m                                ALLERGIES:  has No Known Allergies.  Meds: Current Outpatient Medications  Medication Sig Dispense Refill   acetaminophen (TYLENOL) 500 MG tablet Take 500 mg by mouth every 6 (six) hours as needed (pain.).     atorvastatin (LIPITOR) 10 MG tablet Take 10 mg by mouth every evening.      busPIRone (BUSPAR) 10 MG tablet Take 10 mg by mouth at bedtime.     Calcium Carb-Cholecalciferol (CALCIUM 600+D3 PO) Take 1 tablet by mouth in the morning.     COLLAGEN-VITAMIN C PO Take 2 tablets by mouth in the morning.     Cyanocobalamin (VITAMIN B-12) 2500 MCG SUBL Take 2,500 mcg by mouth in the morning.     estrogens, conjugated, (PREMARIN) 0.625 MG tablet Take 0.3125 mg by mouth every evening.     ferrous sulfate 325 (65 FE) MG EC tablet Take 325 mg by mouth every other day.     gabapentin (NEURONTIN) 300 MG capsule Take 300 mg by mouth at bedtime as needed (restless leg syndrome).     ibuprofen (ADVIL) 200 MG tablet Take 400 mg by mouth every 8 (eight) hours as needed (pain.).     losartan (COZAAR) 25 MG tablet Take 12.5 mg by mouth every evening.     Magnesium 250 MG TABS Take 250 mg by mouth in the morning.     Omega-3 Fatty Acids (FISH OIL) 1000 MG CAPS Take 1,000 mg by mouth in the morning.     sertraline (  ZOLOFT) 50 MG tablet Take 50-75 mg by mouth every evening.     SUMAtriptan (IMITREX) 100 MG tablet Take 100 mg by mouth every 2 (two) hours as needed for migraine (max 2 doses/24hrs.). May repeat in 2 hours if headache persists or recurs.     traMADol (ULTRAM) 50 MG tablet Take 15 mg by mouth every 6 (six) hours as needed.     triamcinolone (NASACORT) 55 MCG/ACT AERO nasal inhaler Place 1 spray into the nose daily.     lidocaine (XYLOCAINE) 2 % solution Patient: Mix 1part 2% viscous lidocaine, 1part H20. Swish & spit 63m of diluted mixture, 350m before meals and at bedtime, up to QID (Patient not taking: Reported on 06/26/2022) 300 mL 1   No current facility-administered medications for this encounter.    Physical Findings: The patient is in no acute distress. Patient is alert and oriented.  height is '5\' 5"'$  (1.651 m) and weight is 120 lb (54.4 kg). Her temperature is 97.8 F (36.6 C). Her blood pressure is 133/77 and her pulse is 76. Her respiration is 18 and oxygen saturation is  98%. .     General: Alert and oriented, in no acute distress HEENT: Head is normocephalic. Extraocular movements are intact.  Floor of mouth and anterior gum and buccal mucosa are clear without any evidence of recurrent cancer.  She has an area where her lower lip was recently biopsied.  External skin over her chin has healed excellently since radiation therapy Neck: Neck is supple, no palpable cervical or supraclavicular lymphadenopathy. Musculoskeletal: symmetric strength and muscle tone throughout. Neurologic: Cranial nerves II through XII are grossly intact. No obvious focalities. Speech is fluent. Coordination is intact. Psychiatric: Judgment and insight are intact. Affect is appropriate.    Lab Findings: Lab Results  Component Value Date   WBC 10.6 (H) 01/08/2022   HGB 12.4 01/08/2022   HCT 38.5 01/08/2022   MCV 87.9 01/08/2022   PLT 399 01/08/2022    Radiographic Findings: CT CHEST LUNG CA SCREEN LOW DOSE W/O CM  Result Date: 06/09/2022 CLINICAL DATA:  Current smoker, 44 pack-year history. EXAM: CT CHEST WITHOUT CONTRAST LOW-DOSE FOR LUNG CANCER SCREENING TECHNIQUE: Multidetector CT imaging of the chest was performed following the standard protocol without IV contrast. RADIATION DOSE REDUCTION: This exam was performed according to the departmental dose-optimization program which includes automated exposure control, adjustment of the mA and/or kV according to patient size and/or use of iterative reconstruction technique. COMPARISON:  None Available. FINDINGS: Cardiovascular: Atherosclerotic calcification of the aorta. Heart size normal. No pericardial effusion. Mediastinum/Nodes: No pathologically enlarged mediastinal or axillary lymph nodes. Hilar regions are difficult to definitively evaluate without IV contrast. Esophagus is grossly unremarkable. Lungs/Pleura: Biapical pleuroparenchymal scarring. Mild centrilobular emphysema. Pulmonary nodules measure 3.7 mm or less in size. No  suspicious pulmonary nodules. No pleural fluid. Airway is unremarkable. Upper Abdomen: Visualized portions of the liver, adrenal glands, kidneys, spleen, pancreas, stomach and bowel are grossly unremarkable. Cholecystectomy. Musculoskeletal: Minimal degenerative change in the spine. No worrisome lytic or sclerotic lesions. IMPRESSION: 1. Lung-RADS 2, benign appearance or behavior. Continue annual screening with low-dose chest CT without contrast in 12 months. 2.  Aortic atherosclerosis (ICD10-I70.0). 3.  Emphysema (ICD10-J43.9). Electronically Signed   By: MeLorin Picket.D.   On: 06/09/2022 13:14    Impression/Plan: She has healed well from radiation therapy.  I recommended that she follow-up with plastic surgery ( Dr LuErin Hearingwhom she saw as well in the past) and continue to follow  closely with dermatology.  She is waiting for results from her recent biopsy of her lip.  She will talk to plastic surgery about physical therapy and if it is indicated for the tightness that she feels in her chin postoperatively/postradiation therapy.  We discussed the importance of following closely with dentistry and getting her teeth cleaned every 4 months.  If any extractions are ever recommended in her anterior mandible she needs to have her dentist speak with me as she is at high risk for wound healing issues given previous radiation exposure to the mandible.  We discussed sun hygiene as well.  I will see her back in 6 months, sooner if needed.  She is pleased with this plan.  On date of service, in total, I spent 25 minutes on this encounter. Patient was seen in person.  _____________________________________   Eppie Gibson, MD

## 2022-07-16 ENCOUNTER — Ambulatory Visit: Payer: BC Managed Care – PPO | Admitting: Plastic Surgery

## 2022-07-17 ENCOUNTER — Ambulatory Visit: Payer: BC Managed Care – PPO | Admitting: Physician Assistant

## 2022-07-30 ENCOUNTER — Ambulatory Visit: Payer: Medicare PPO | Admitting: Plastic Surgery

## 2022-07-30 DIAGNOSIS — Z923 Personal history of irradiation: Secondary | ICD-10-CM | POA: Diagnosis not present

## 2022-07-30 DIAGNOSIS — C44319 Basal cell carcinoma of skin of other parts of face: Secondary | ICD-10-CM | POA: Diagnosis not present

## 2022-07-30 DIAGNOSIS — Z08 Encounter for follow-up examination after completed treatment for malignant neoplasm: Secondary | ICD-10-CM | POA: Diagnosis not present

## 2022-08-02 NOTE — Progress Notes (Signed)
Patient is most recent status post composite grafting to her chin.  She has had radiation treatment.  She is overall doing very well and happy with her contour.  Physical exam Overall good contour to the chin with subtle scarring, no signs of recurrence  Assessment and plan Good result after reconstruction of chin.  I do not think she will need additional surgery.

## 2022-08-18 IMAGING — CT CT ABD-PELV W/O CM
1 of 2 series · 14 of 32 positions shown, 19 images · non-contrast
Comparison: None.

CLINICAL DATA: GERD without esophagitis

Abdominal pain for 2 months
Prior cholecystectomy, hysterectomy, appendectomy
EXAM:
CT ABDOMEN AND PELVIS WITHOUT CONTRAST
TECHNIQUE: Multidetector CT imaging of the abdomen and pelvis was performed
following the standard protocol without IV contrast.

[Series 2: abd/pelvis w/(date) · axial · 0.68mm/px · z∈[+447,+837]mm · 14 of 88 slices shown, 19 images]
[im 5/88  soft-tissue]
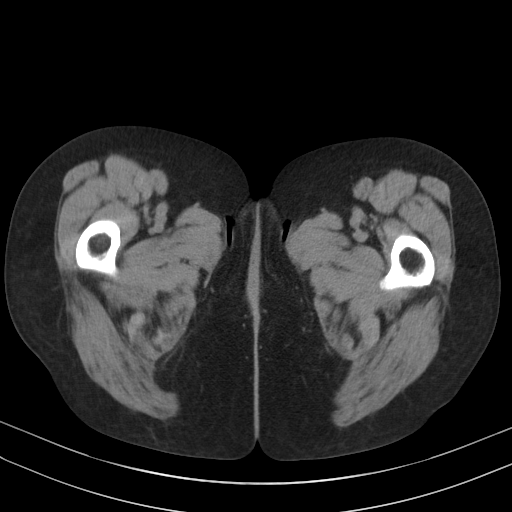
[im 5/88  bone]
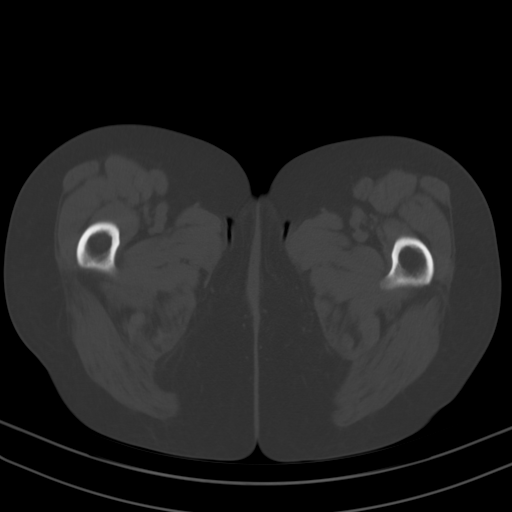
[im 14/88  soft-tissue]
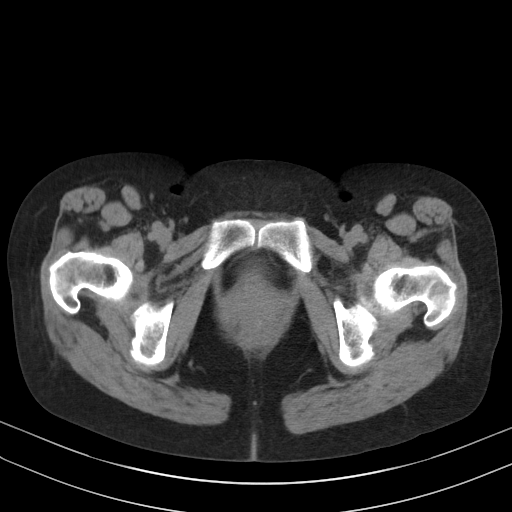
[im 19/88  soft-tissue]
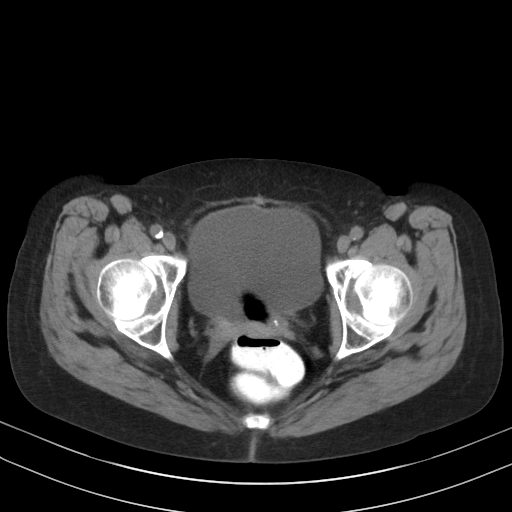
[im 23/88  soft-tissue]
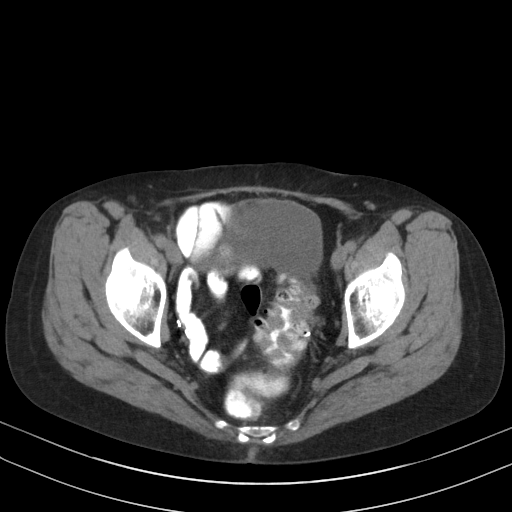
[im 33/88  soft-tissue]
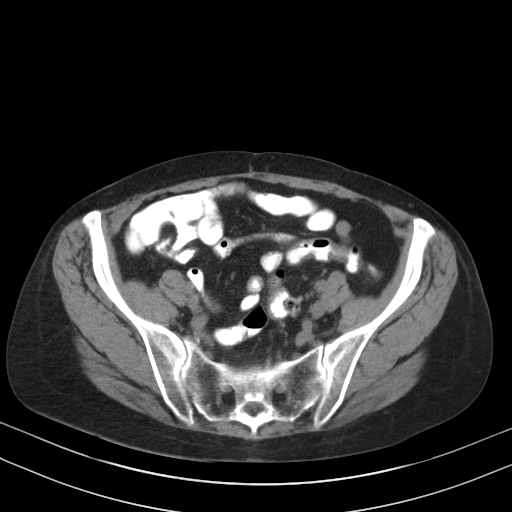
[im 37/88  soft-tissue]
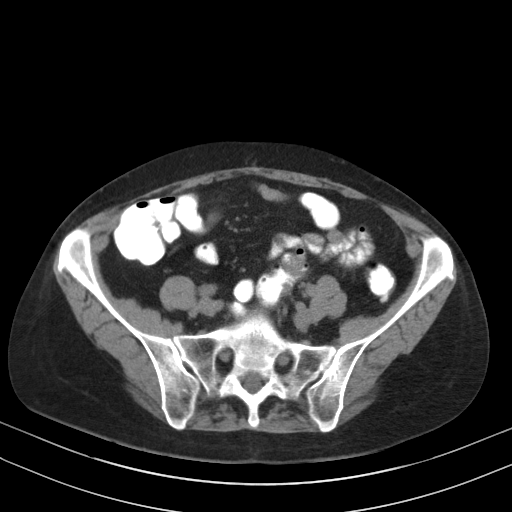
[im 46/88  soft-tissue]
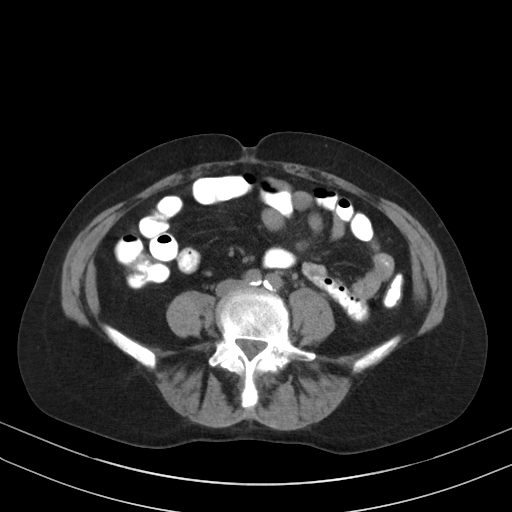
[im 51/88  soft-tissue]
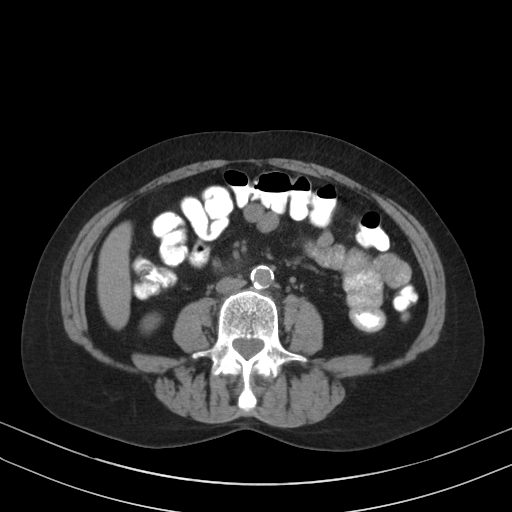
[im 55/88  soft-tissue]
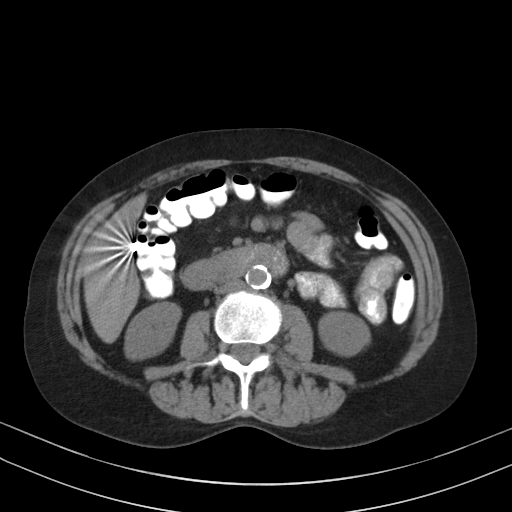
[im 55/88  bone]
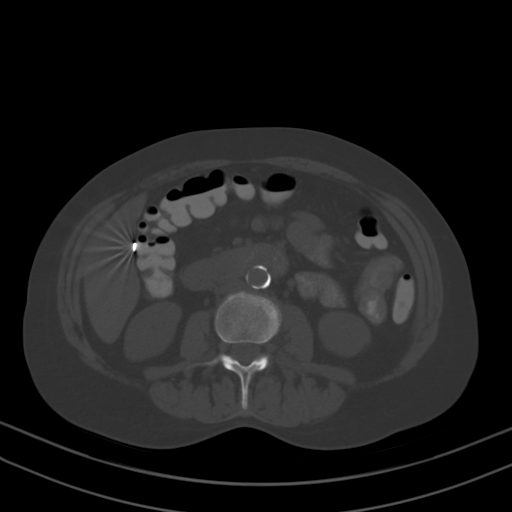
[im 65/88  soft-tissue]
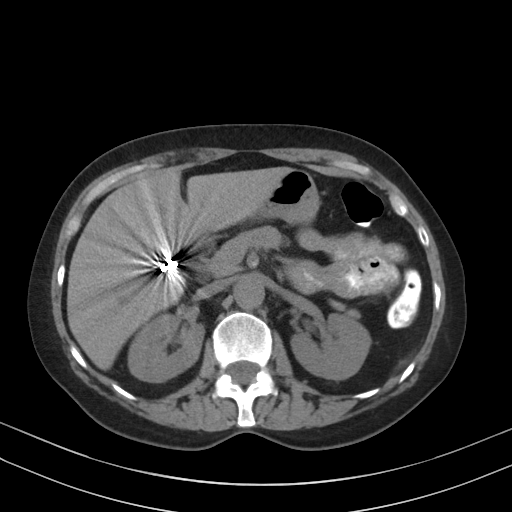
[im 69/88  soft-tissue]
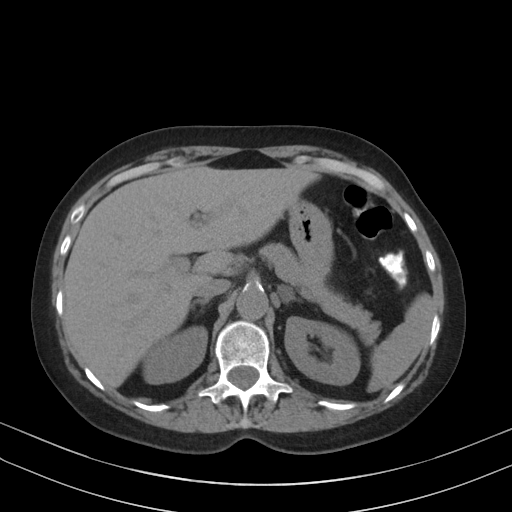
[im 69/88  lung]
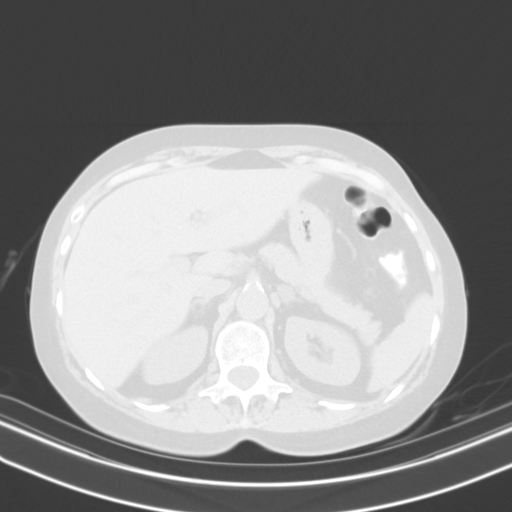
[im 74/88  soft-tissue]
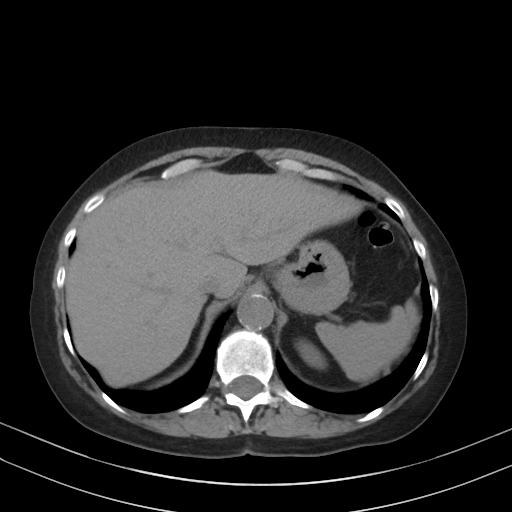
[im 74/88  lung]
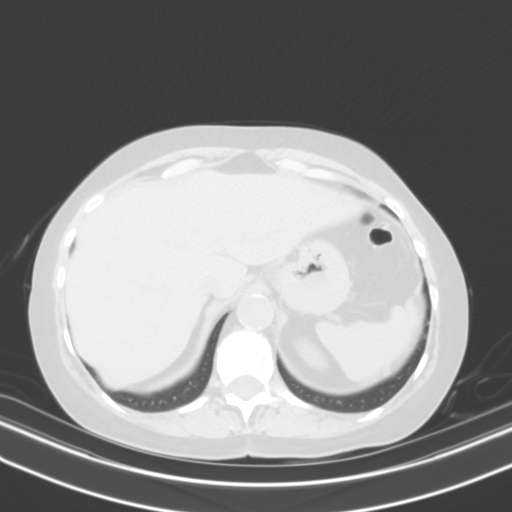
[im 78/88  lung]
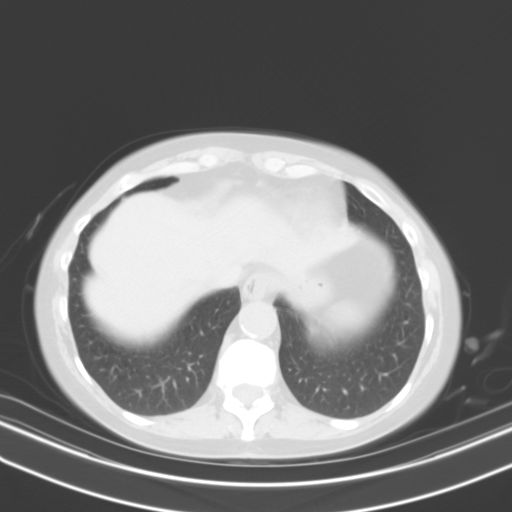
[im 83/88  soft-tissue]
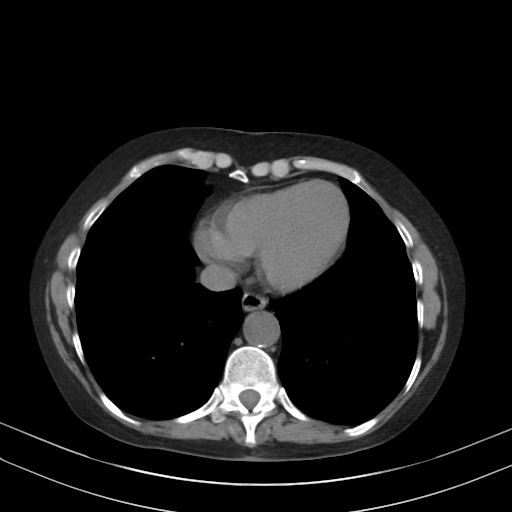
[im 83/88  lung]
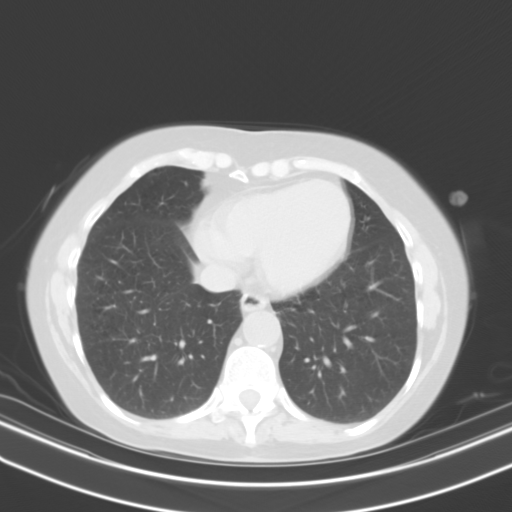

[14 of 32 positions shown; findings below may reference images not displayed]

FINDINGS: Lower chest: Mild emphysematous changes of the lung bases.

Hepatobiliary: No focal liver abnormality is seen. Status post
cholecystectomy. No biliary dilatation.

Pancreas: Unremarkable. No pancreatic ductal dilatation or
surrounding inflammatory changes.

Spleen: Normal in size without focal abnormality.

Adrenals/Urinary Tract: 1.2 cm low-density nodule in the left
adrenal body consistent with an adenoma. Adrenal glands otherwise
normal.

No significant abnormality of the kidneys, ureters, or bladder.

Stomach/Bowel: No significant abnormality of the stomach or small
bowel.

Diverticulosis of the descending and sigmoid colon. There is
thickening of the wall the sigmoid colon with adjacent fat stranding
suspicious for diverticulitis.

Vascular/Lymphatic: No enlarged abdominal or pelvic lymph nodes.
Atherosclerotic plaque seen throughout the abdominal aorta and
visualized branches.

Reproductive: Postsurgical changes of hysterectomy. No adnexal
abnormality identified.

Other: No abdominal wall hernia or abnormality. No abdominopelvic
ascites.

Musculoskeletal: No acute or significant osseous findings.
IMPRESSION: Thickening of the sigmoid colon wall with adjacent fat stranding
most consistent with acute diverticulitis. No evidence of
perforation or abscess. Correlation with colonoscopy should be
performed when patient condition allows to exclude underlying mass.

## 2022-12-23 ENCOUNTER — Ambulatory Visit
Admission: RE | Admit: 2022-12-23 | Discharge: 2022-12-23 | Disposition: A | Discharge status: Home / Self Care | Payer: Medicare PPO | Source: Ambulatory Visit | Attending: Radiation Oncology | Admitting: Radiation Oncology

## 2022-12-23 ENCOUNTER — Other Ambulatory Visit: Payer: Self-pay

## 2022-12-23 VITALS — BP 132/83 | HR 80 | Temp 97.4°F | Resp 18 | Ht 65.0 in | Wt 119.0 lb

## 2022-12-23 DIAGNOSIS — R682 Dry mouth, unspecified: Secondary | ICD-10-CM | POA: Diagnosis not present

## 2022-12-23 DIAGNOSIS — C44319 Basal cell carcinoma of skin of other parts of face: Secondary | ICD-10-CM

## 2022-12-23 DIAGNOSIS — Z923 Personal history of irradiation: Secondary | ICD-10-CM | POA: Insufficient documentation

## 2022-12-23 DIAGNOSIS — Z79899 Other long term (current) drug therapy: Secondary | ICD-10-CM | POA: Diagnosis not present

## 2022-12-23 DIAGNOSIS — I890 Lymphedema, not elsewhere classified: Secondary | ICD-10-CM | POA: Insufficient documentation

## 2022-12-23 NOTE — Progress Notes (Signed)
Radiation Oncology         [phone] ________________________________  Name: Tonya Delgado MRN: [id number]  Date: 12/23/2022  DOB: August 21, 1956  Follow-Up Visit Note  Outpatient  CC: Tonya Delgado, Tonya Klinefelter, MD  Diagnosis and Prior Radiotherapy:    ICD-10-CM   1. Basal cell carcinoma of chin  C44.319       Cancer Staging  Basal cell carcinoma of chin Staging form: Cutaneous Carcinoma of the Head and Neck, AJCC 8th Edition - Pathologic stage from 12/05/2021: Stage IV (pT4a, pNX, cM0) - Signed by Eppie Gibson, MD on 12/08/2021 Extraosseous extension: Present  Radiation Treatment Dates: 04/08/2022 through 05/20/2022 Site Technique Total Dose (Gy) Dose per Fx (Gy) Completed Fx Beam Energies  Face: HN_chin IMRT 60/60 2 30/30 6X   CHIEF COMPLAINT: Here for follow-up and surveillance of chin cancer  Narrative:  The patient returns today for routine follow-up.  Tonya Delgado is here for follow up after treatment to her face/chin which finished on 05/20/2022.  She reports some tightness at her chin. She reports difficulty with keeping her mouth shut.   Pain issues, if any: Denies pain, but does report continued tightness across chin Weight changes, if any:     Wt Readings from Last 3 Encounters:  12/23/22 119 lb (54 kg)  06/26/22 120 lb (54.4 kg)  04/01/22 120 lb 9.6 oz (54.7 kg)    Swallowing issues, if any: Denies. Reports she can eat/drink a wide variety  Smoking or chewing tobacco? Continues to smoke occasionally Using fluoride trays daily? N/A--denies any new dental concerns Last dermatology visit was on: Saw Tonya Delgado Sites with Plastic Surgery on 07/30/2022:  "Patient is most recent status post composite grafting to her chin.  She has had radiation treatment.  She is overall doing very well and happy with her contour. --Physical exam Overall good contour to the chin with subtle scarring, no signs of recurrence --Assessment and plan Good result after reconstruction  of chin.  I do not think she will need additional surgery."   Other notable issues, if any: Has not been able to establish care with new providers since Tonya Delgado and Tonya Delgado left their respective practices. Does report she's not able to fully close her mouth/lips without effort. This causes her to have dry mouth (especially at night while sleeping).   BP 132/83 (BP Location: Left Arm, Patient Position: Sitting)   Pulse 80   Temp (!) 97.4 F (36.3 C) (Temporal)   Resp 18   Ht 5' 5"$  (1.651 m)   Wt 119 lb (54 kg)   SpO2 99%   BMI 19.80 kg/m                                ALLERGIES:  has No Known Allergies.  Meds: Current Outpatient Medications  Medication Sig Dispense Refill   acetaminophen (TYLENOL) 500 MG tablet Take 500 mg by mouth every 6 (six) hours as needed (pain.).     atorvastatin (LIPITOR) 10 MG tablet Take 10 mg by mouth every evening.     busPIRone (BUSPAR) 10 MG tablet Take 10 mg by mouth at bedtime.     Calcium Carb-Cholecalciferol (CALCIUM 600+D3 PO) Take 1 tablet by mouth in the morning.     COLLAGEN-VITAMIN C PO Take 2 tablets by mouth in the morning.     Cyanocobalamin (VITAMIN B-12) 2500 MCG SUBL Take 2,500 mcg by mouth in the morning.  estrogens, conjugated, (PREMARIN) 0.625 MG tablet Take 0.3125 mg by mouth every evening.     ferrous sulfate 325 (65 FE) MG EC tablet Take 325 mg by mouth every other day.     gabapentin (NEURONTIN) 300 MG capsule Take 300 mg by mouth at bedtime as needed (restless leg syndrome).     ibuprofen (ADVIL) 200 MG tablet Take 400 mg by mouth every 8 (eight) hours as needed (pain.).     losartan (COZAAR) 25 MG tablet Take 12.5 mg by mouth every evening.     Magnesium 250 MG TABS Take 250 mg by mouth in the morning.     Omega-3 Fatty Acids (FISH OIL) 1000 MG CAPS Take 1,000 mg by mouth in the morning.     sertraline (ZOLOFT) 50 MG tablet Take 50-75 mg by mouth every evening.     SUMAtriptan (IMITREX) 100 MG tablet Take 100 mg by mouth  every 2 (two) hours as needed for migraine (max 2 doses/24hrs.). May repeat in 2 hours if headache persists or recurs.     traMADol (ULTRAM) 50 MG tablet Take 15 mg by mouth every 6 (six) hours as needed.     triamcinolone (NASACORT) 55 MCG/ACT AERO nasal inhaler Place 1 spray into the nose daily.     No current facility-administered medications for this encounter.    Physical Findings: The patient is in no acute distress. Patient is alert and oriented.  height is 5' 5"$  (1.651 m) and weight is 119 lb (54 kg). Her temporal temperature is 97.4 F (36.3 C) (abnormal). Her blood pressure is 132/83 and her pulse is 80. Her respiration is 18 and oxygen saturation is 99%. .     General: Alert and oriented, in no acute distress HEENT: Head is normocephalic. Extraocular movements are intact.  Symmetrically full submandibular glands with mild to moderate lymphedema in the submental region. Scar over chin has healed nicely with no sign of disease recurrence. Mucosa in the anterior mouth appears healthy. No sign of recurrence in the inner lip or gum or floor of mouth. The rest of her neck nodes and periauricular nodes are all clear to palpation.   Neck: Neck is supple, no palpable cervical or supraclavicular lymphadenopathy. Musculoskeletal: symmetric strength and muscle tone throughout. Neurologic: Cranial nerves II through XII are grossly intact. No obvious focalities. Speech is fluent. Coordination is intact. Psychiatric: Judgment and insight are intact. Affect is appropriate.    Lab Findings: Lab Results  Component Value Date   WBC 10.6 (H) 01/08/2022   HGB 12.4 01/08/2022   HCT 38.5 01/08/2022   MCV 87.9 01/08/2022   PLT 399 01/08/2022    Radiographic Findings: No results found.  Impression/Plan: Patient is recovering well from radiation therapy. There is no evidence of disease recurrence on clinical exam today.   Physical therapy referral made today to help with her lymphedema and skin  tightness around her chin.   She sees her dentist every 4 months. She is scheduled to see them in 1 month. She denies using fluoride rinses, but is using "Restore" rinses recommended by her dentist. We recommend talking to her dentist about using fluoride rinses or PREVIDENT 5000 to prevent decay at her next appointment.   She continues to see her dermatologist regularly.   I will see patient back in 6 months for routine follow up.   On date of service, in total, I spent 25 minutes on this encounter. Patient was seen in person.  _____________________________________   Leona Singleton,  PA    Eppie Gibson, MD

## 2022-12-23 NOTE — Progress Notes (Signed)
Tonya Delgado presents today for follow-up after completing radiation to her chin on 05/20/2022  Pain issues, if any: Denies pain, but does report continued tightness across chin Weight changes, if any:  Wt Readings from Last 3 Encounters:  12/23/22 119 lb (54 kg)  06/26/22 120 lb (54.4 kg)  04/01/22 120 lb 9.6 oz (54.7 kg)   Swallowing issues, if any: Denies. Reports she can eat/drink a wide variety  Smoking or chewing tobacco? Continues to smoke occasionally Using fluoride trays daily? N/A--denies any new dental concerns Last dermatology visit was on: Saw Dr. [name] with Plastic Surgery on 07/30/2022:  "Patient is most recent status post composite grafting to her chin.  She has had radiation treatment.  She is overall doing very well and happy with her contour. --Physical exam Overall good contour to the chin with subtle scarring, no signs of recurrence --Assessment and plan Good result after reconstruction of chin.  I do not think she will need additional surgery."  Other notable issues, if any: Has not been able to establish care with new providers since Dr. Claudia Desanctis and Dr. Erin Hearing left their respective practices. Does report she's not able to fully close her mouth/lips without effort. This causes her to have dry mouth (especially at night while sleeping).

## 2022-12-23 NOTE — Progress Notes (Signed)
Oncology Nurse Navigator Documentation   I met with Tonya Delgado before and during her follow up appointment with Dr. Isidore Moos today. She is doing well but does report edema and scar tissue to her previous surgical site and radiation site. An order for PT evaluation has been placed and she has also been scheduled for a future appointment in 6 months with Dr. Isidore Moos. She knows to call me if she has any concerns or questions.  Tonya Asa RN, BSN, OCN Head & Neck Oncology Nurse [place] at [facility] Phone # [phone]  Fax # [phone]

## 2022-12-23 NOTE — Therapy (Signed)
OUTPATIENT PHYSICAL THERAPY HEAD AND NECK EVALUATION   Patient Name: Tonya Delgado MRN: [id number] DOB:[birth year], 67 y.o., female Today's Date: 12/24/2022  END OF SESSION:  PT End of Session - 12/24/22 0912     Number of Visits 9    Date for PT Re-Evaluation 01/21/23    Authorization Type [employer] auth needed sent in 12/24/22    PT Start Time 0805    PT Stop Time 0858    PT Time Calculation (min) 53 min    Activity Tolerance Patient tolerated treatment well    Behavior During Therapy [facility] for tasks assessed/performed             Past Medical History:  Diagnosis Date   Anxiety    Depression    Hyperlipidemia    Hypertension    Pneumonia    Skin 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,bilateral shoulders, chest, and chin   Past Surgical History:  Procedure Laterality Date   ADJACENT TISSUE TRANSFER/TISSUE REARRANGEMENT N/A 01/13/2022   Procedure: Repair of chin with local tissue rearrangement;  Surgeon: [name], MD;  Location: [place];  Service: Plastics;  Laterality: N/A;  1 hr   APPENDECTOMY     CESAREAN SECTION     x 2   CHOLECYSTECTOMY     DG GALL BLADDER     LAPAROSCOPIC HYSTERECTOMY     SKIN FULL THICKNESS GRAFT N/A 12/05/2021   Procedure: local tissue rearrangment, mucosal closure and burring of mandible bone;  Surgeon: [name], MD;  Location: [place];  Service: Plastics;  Laterality: N/A;   Patient Active Problem List   Diagnosis Date Noted   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 Isidore Moos  REFERRING DIAG:  Diagnosis  C44.319 (ICD-10-CM) - Basal cell carcinoma of chin    THERAPY DIAG:  Basal cell carcinoma of chin  Lymphedema, not elsewhere classified  Aftercare following surgery for neoplasm  Rationale for Evaluation and Treatment: Rehabilitation  ONSET DATE: 11/2021  SUBJECTIVE:     SUBJECTIVE STATEMENT:    I have difficulty closing my mouth and trouble with the swelling under  the chin. It started with the swelling more after the second procedure.    PERTINENT HISTORY:  January 2023 [facility] recurrence with MOHS procedure getting all the way to the bone.  Then Dr. Erin Hearing did an initial closure but it popped back open. A few weeks later he reclosed it and it was so tight I could hardly talk. A third time they removed some scar tissue and did a abdominal skin graft. The final day of surgery was 03/16/22.  Radiation completed just to the chin x 6 weeks.  Has a very dry mouth. In 2016 the first cancer at the chin they removed cancer from the skin and pulled the neck skin up to meet the chin.  No LN removed.    PATIENT GOALS:   to be educated about the signs and symptoms of lymphedema and learn post op HEP.   PAIN:  Are you having pain? Intermittent ear pain on the outside  Right ear 4/10 Throbbing/dull  Nothing makes it better or worse  PRECAUTIONS: none  WEIGHT BEARING RESTRICTIONS: No  FALLS:  Has patient fallen in last 6 months? No Does the patient have a fear of falling that limits activity? No Is the patient reluctant to leave the house due to a fear of falling?No  LIVING ENVIRONMENT: Patient lives with:  husband  OCCUPATION: retired  Psychologist, clinical: knitting, reading, cooking,  PRIOR LEVEL OF FUNCTION: Independent   OBJECTIVE:  COGNITION: Overall cognitive status: Within functional limits for tasks assessed                  POSTURE:  Forward head and rounded shoulders posture  SHOULDER AROM:   Full and MMT 5/5   CERVICAL AROM:   Percent limited  Flexion 50 - tight posterior neck  Extension 41 - tight front of the neck  Right lateral flexion 40  Left lateral flexion 40  Right rotation 70  Left rotation 70    (Blank rows=not tested)   LYMPHEDEMA ASSESSMENT:   Circumference in cm  4 cm superior to sternal notch around neck 25.9  6 cm superior to sternal notch around neck 34  8 cm superior to sternal notch around neck 34  R lateral nostril from  base of nose to medial tragus 10.4  L lateral nostril from base of nose to medial tragus 10.4  R corner of mouth to where ear lobe meets face 9.5  L corner of mouth to where ear lobe meets face 9.2  Chin strap tragus to tragus 25     (Blank rows=not tested)   OTHER SYMPTOMS: Pain No Fibrosis No Pitting edema No Infections No Decreased scar mobility Yes  PALPATION: Minimal fat pad and soft tissue at the chin incision. Mild signs of lymphedema anterior neck but not pitting or soft. Tightness may be more present than lymphedema.    TODAY"S TREATMENT: Supine HOB elevated with 1 pillow: PT performed and explained principles of MLD and lymphedema post radiation  Peformed modified anterior norton per below due to no LN removed and pt size of chest  PATIENT EDUCATION:  Education details: briefly anterior norton modified, POC  Person educated: Patient Education method: Explanation, Demonstration, Tactile cues, Verbal cues, and Handouts Education comprehension: verbalized understanding and needs further education  HOME EXERCISE PROGRAM: MLD  ASSESSMENT:  CLINICAL IMPRESSION: Pt presents 7 months post radiation to the chin area due to [facility] recurrence.  She has an extensive surgical hx in the area with complications requiring skin grafting to close a salivary leak.  She has mild lymphedema and mild to moderate tightness in the musculature surrounding the area but no significant pain or limited ROM.    Pt will benefit from skilled therapeutic intervention to improve on the following deficits: decreased knowledge of condition, decreased knowledge of use of DME, increased edema, and increased fascial restrictions  PT treatment/interventions: ADL/Self care home management, Therapeutic exercises, Therapeutic activity, Neuromuscular re-education, Patient/Family education, Self Care, DME instructions, Manual lymph drainage, Compression bandaging, scar mobilization, Taping, Manual therapy, and  Re-evaluation   GOALS Name Target Date  Goal status  1 Pt will be ind with MLD for the anterior neck and chin 01/21/23 NEW  2 Pt will be ind with scar massage or cupping for the neck and chin area 01/21/23 NEW  3 Pt will report no Rt ear region pain x 1-2 days 01/21/23 NEW  4 Pt will be ind with final HEP 01/21/23 NEW     GOALS: Goals reviewed with patient? Yes  LONG TERM GOALS:  (STG=LTG)   PLAN:  PT FREQUENCY/DURATION: 2x per week x 4 weeks   PLAN FOR NEXT SESSION: anterior norton modified MLD education and performance, (removed steps 4, 6, 7) STM neck, scar mob, asked Dr. Isidore Moos about cupping opinion, show compression options and make flat foam pack.  Stark Bray, PT 12/24/2022, 9:19 AM

## 2022-12-24 ENCOUNTER — Ambulatory Visit: Payer: Medicare PPO | Attending: Radiation Oncology | Admitting: Rehabilitation

## 2022-12-24 ENCOUNTER — Encounter: Payer: Self-pay | Admitting: Rehabilitation

## 2022-12-24 DIAGNOSIS — C44319 Basal cell carcinoma of skin of other parts of face: Secondary | ICD-10-CM | POA: Insufficient documentation

## 2022-12-24 DIAGNOSIS — I890 Lymphedema, not elsewhere classified: Secondary | ICD-10-CM | POA: Diagnosis present

## 2022-12-24 DIAGNOSIS — Z483 Aftercare following surgery for neoplasm: Secondary | ICD-10-CM | POA: Diagnosis present

## 2022-12-29 ENCOUNTER — Encounter: Payer: Self-pay | Admitting: Rehabilitation

## 2022-12-29 ENCOUNTER — Ambulatory Visit: Payer: Medicare PPO | Admitting: Rehabilitation

## 2022-12-29 DIAGNOSIS — I890 Lymphedema, not elsewhere classified: Secondary | ICD-10-CM

## 2022-12-29 DIAGNOSIS — C44319 Basal cell carcinoma of skin of other parts of face: Secondary | ICD-10-CM

## 2022-12-29 DIAGNOSIS — Z483 Aftercare following surgery for neoplasm: Secondary | ICD-10-CM

## 2022-12-29 NOTE — Therapy (Signed)
OUTPATIENT PHYSICAL THERAPY HEAD AND NECK TREATMENT   Patient Name: Tonya Delgado MRN: [id number] DOB:Mar 18, 1956, 67 y.o., female Today's Date: 12/29/2022  END OF SESSION:  PT End of Session - 12/29/22 0759     Visit Number 2    Number of Visits 9    Date for PT Re-Evaluation 01/21/23    Authorization Type ok for 9 visits until 01/21/23    Authorization - Visit Number 2    Authorization - Number of Visits 9    PT Start Time 0802    PT Stop Time 0858    PT Time Calculation (min) 56 min    Activity Tolerance Patient tolerated treatment well    Behavior During Therapy [facility] for tasks assessed/performed             Past Medical History:  Diagnosis Date   Anxiety    Depression    Hyperlipidemia    Hypertension    Pneumonia    Skin 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,bilateral shoulders, chest, and chin   Past Surgical History:  Procedure Laterality Date   ADJACENT TISSUE TRANSFER/TISSUE REARRANGEMENT N/A 01/13/2022   Procedure: Repair of chin with local tissue rearrangement;  Surgeon: [name], MD;  Location: [place];  Service: Plastics;  Laterality: N/A;  1 hr   APPENDECTOMY     CESAREAN SECTION     x 2   CHOLECYSTECTOMY     DG GALL BLADDER     LAPAROSCOPIC HYSTERECTOMY     SKIN FULL THICKNESS GRAFT N/A 12/05/2021   Procedure: local tissue rearrangment, mucosal closure and burring of mandible bone;  Surgeon: [name], MD;  Location: [place];  Service: Plastics;  Laterality: N/A;   Patient Active Problem List   Diagnosis Date Noted   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 Isidore Moos  REFERRING DIAG:  Diagnosis  C44.319 (ICD-10-CM) - Basal cell carcinoma of chin    THERAPY DIAG:  Basal cell carcinoma of chin  Lymphedema, not elsewhere classified  Aftercare following surgery for neoplasm  Rationale for Evaluation and Treatment: Rehabilitation  ONSET DATE: 11/2021  SUBJECTIVE:     SUBJECTIVE  STATEMENT:     Nothing new  PERTINENT HISTORY:  January 2023 [facility] recurrence with MOHS procedure getting all the way to the bone.  Then Dr. Erin Hearing did an initial closure but it popped back open. A few weeks later he reclosed it and it was so tight I could hardly talk. A third time they removed some scar tissue and did a abdominal skin graft. The final day of surgery was 03/16/22.  Radiation completed just to the chin x 6 weeks.  Has a very dry mouth. In 2016 the first cancer at the chin they removed cancer from the skin and pulled the neck skin up to meet the chin.  No LN removed.    PATIENT GOALS:   to be educated about the signs and symptoms of lymphedema and learn post op HEP.   PAIN:  Are you having pain? 0/10  PRECAUTIONS: none  WEIGHT BEARING RESTRICTIONS: No  FALLS:  Has patient fallen in last 6 months? No Does the patient have a fear of falling that limits activity? No Is the patient reluctant to leave the house due to a fear of falling?No  LIVING ENVIRONMENT: Patient lives with: husband  OCCUPATION: retired  Psychologist, clinical: knitting, reading, cooking,  [place] FUNCTION: Independent  OBJECTIVE:  COGNITION: Overall cognitive status: Within functional limits for tasks assessed                  POSTURE:  Forward head and rounded shoulders posture  SHOULDER AROM:   Full and MMT 5/5   CERVICAL AROM:   Percent limited  Flexion 50 - tight posterior neck  Extension 41 - tight front of the neck  Right lateral flexion 40  Left lateral flexion 40  Right rotation 70  Left rotation 70    (Blank rows=not tested)   LYMPHEDEMA ASSESSMENT:   Circumference in cm  4 cm superior to sternal notch around neck 25.9  6 cm superior to sternal notch around neck 34  8 cm superior to sternal notch around neck 34  R lateral nostril from base of nose to medial tragus 10.4  L lateral nostril from base of nose to medial tragus 10.4  R corner of mouth to where ear lobe meets face  9.5  L corner of mouth to where ear lobe meets face 9.2  Chin strap tragus to tragus 25     (Blank rows=not tested)   OTHER SYMPTOMS: Pain No Fibrosis No Pitting edema No Infections No Decreased scar mobility Yes  PALPATION: Minimal fat pad and soft tissue at the chin incision. Mild signs of lymphedema anterior neck but not pitting or soft. Tightness may be more present than lymphedema.    TODAY"S TREATMENT: 12/29/22 Showed pt compression options and pt opted for [facility] which she will order on [employer] size medium - we discussed putting silicone on scar under compression trial Reviewed self MLD in seated in front of the mirror then PT performed all steps in supine [name] anterior approach handout as follows (omit 4, 6, 7) : short neck, 5 diaphragmatic breaths, bilateral axillary nodes, bilateral pectoral nodes, anterior chest, short neck, posterior neck moving fluid towards pathway aimed at lateral neck, then lateral and anterior neck moving fluid towards pathway aimed at lateral neck then retracing all steps  STM bil neck: UT, LS, anterior neck, suboccipitals, submental region and scar mobility with gentle pressure for the first day.    EVAL Supine HOB elevated with 1 pillow: PT performed and explained principles of MLD and lymphedema post radiation  Peformed modified anterior norton per below due to no LN removed and pt size of chest  PATIENT EDUCATION:  Education details: briefly anterior norton modified, POC  Person educated: Patient Education method: Explanation, Demonstration, Tactile cues, Verbal cues, and Handouts Education comprehension: verbalized understanding and needs further education  HOME EXERCISE PROGRAM: MLD  ASSESSMENT:  CLINICAL IMPRESSION: Pt tolerated all well and is performing her self MLD very well with minimal cueing changes needed.  Pt will order [place].  Chin presents with moderate restriction.   Pt will benefit from skilled therapeutic  intervention to improve on the following deficits: decreased knowledge of condition, decreased knowledge of use of DME, increased edema, and increased fascial restrictions  PT treatment/interventions: ADL/Self care home management, Therapeutic exercises, Therapeutic activity, Neuromuscular re-education, Patient/Family education, Self Care, DME instructions, Manual lymph drainage, Compression bandaging, scar mobilization, Taping, Manual therapy, and Re-evaluation   GOALS Name Target Date  Goal status  1 Pt will be ind with MLD for the anterior neck and chin 01/21/23 NEW  2 Pt will be ind with scar massage or cupping for the neck and chin area 01/21/23 NEW  3 Pt will report no Rt ear region pain x 1-2 days 01/21/23 NEW  4 Pt will be ind with final HEP 01/21/23 NEW     GOALS: Goals reviewed with patient? Yes  LONG TERM GOALS:  (STG=LTG)   PLAN:  PT FREQUENCY/DURATION: 2x per week x 4 weeks   PLAN FOR NEXT SESSION: anterior norton modified MLD education and performance, (removed steps 4, 6, 7) STM neck, scar mob, asked Dr. Isidore Moos about cupping opinion and she responded that she didn't know much about it, will order Gar Gibbon, PT 12/29/2022, 8:59 AM

## 2022-12-31 ENCOUNTER — Ambulatory Visit: Payer: Medicare PPO | Admitting: Rehabilitation

## 2022-12-31 ENCOUNTER — Encounter: Payer: Self-pay | Admitting: Rehabilitation

## 2022-12-31 DIAGNOSIS — Z483 Aftercare following surgery for neoplasm: Secondary | ICD-10-CM

## 2022-12-31 DIAGNOSIS — C44319 Basal cell carcinoma of skin of other parts of face: Secondary | ICD-10-CM

## 2022-12-31 DIAGNOSIS — I890 Lymphedema, not elsewhere classified: Secondary | ICD-10-CM

## 2022-12-31 NOTE — Therapy (Signed)
OUTPATIENT PHYSICAL THERAPY HEAD AND NECK TREATMENT   Patient Name: Tonya Delgado MRN: [id number] DOB:23-May-1956, 67 y.o., female Today's Date: 12/31/2022  END OF SESSION:  PT End of Session - 12/31/22 0800     Visit Number 3    Number of Visits 9    Date for PT Re-Evaluation 01/21/23    Authorization Type ok for 9 visits until 01/21/23    Authorization - Visit Number 3    Authorization - Number of Visits 9    PT Start Time 0802    Activity Tolerance Patient tolerated treatment well    Behavior During Therapy [facility] for tasks assessed/performed              Past Medical History:  Diagnosis Date   Anxiety    Depression    Hyperlipidemia    Hypertension    Pneumonia    Skin 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,bilateral shoulders, chest, and chin   Past Surgical History:  Procedure Laterality Date   ADJACENT TISSUE TRANSFER/TISSUE REARRANGEMENT N/A 01/13/2022   Procedure: Repair of chin with local tissue rearrangement;  Surgeon: [name], MD;  Location: [place];  Service: Plastics;  Laterality: N/A;  1 hr   APPENDECTOMY     CESAREAN SECTION     x 2   CHOLECYSTECTOMY     DG GALL BLADDER     LAPAROSCOPIC HYSTERECTOMY     SKIN FULL THICKNESS GRAFT N/A 12/05/2021   Procedure: local tissue rearrangment, mucosal closure and burring of mandible bone;  Surgeon: [name], MD;  Location: [place];  Service: Plastics;  Laterality: N/A;   Patient Active Problem List   Diagnosis Date Noted   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 Isidore Moos  REFERRING DIAG:  Diagnosis  C44.319 (ICD-10-CM) - Basal cell carcinoma of chin    THERAPY DIAG:  Basal cell carcinoma of chin  Aftercare following surgery for neoplasm  Lymphedema, not elsewhere classified  Rationale for Evaluation and Treatment: Rehabilitation  ONSET DATE: 11/2021  SUBJECTIVE:     SUBJECTIVE STATEMENT:     I felt good after last time. More relaxed  than anything  PERTINENT HISTORY:  January 2023 [facility] recurrence with MOHS procedure getting all the way to the bone.  Then Dr. Erin Hearing did an initial closure but it popped back open. A few weeks later he reclosed it and it was so tight I could hardly talk. A third time they removed some scar tissue and did a abdominal skin graft. The final day of surgery was 03/16/22.  Radiation completed just to the chin x 6 weeks.  Has a very dry mouth. In 2016 the first cancer at the chin they removed cancer from the skin and pulled the neck skin up to meet the chin.  No LN removed.    PATIENT GOALS:   to be educated about the signs and symptoms of lymphedema and learn post op HEP.   PAIN:  Are you having pain? 0/10  PRECAUTIONS: none  WEIGHT BEARING RESTRICTIONS: No  FALLS:  Has patient fallen in last 6 months? No Does the patient have a fear of falling that limits activity? No Is the patient reluctant to leave the house due to a fear of falling?No  LIVING ENVIRONMENT: Patient lives with: husband  OCCUPATION: retired  LEISURE: knitting, reading, cooking,  PRIOR LEVEL OF FUNCTION: Independent   OBJECTIVE:  COGNITION: Overall cognitive status: Within  functional limits for tasks assessed                  POSTURE:  Forward head and rounded shoulders posture  SHOULDER AROM:   Full and MMT 5/5   CERVICAL AROM:   Percent limited  Flexion 50 - tight posterior neck  Extension 41 - tight front of the neck  Right lateral flexion 40  Left lateral flexion 40  Right rotation 70  Left rotation 70    (Blank rows=not tested)   LYMPHEDEMA ASSESSMENT:   Circumference in cm  4 cm superior to sternal notch around neck 25.9  6 cm superior to sternal notch around neck 34  8 cm superior to sternal notch around neck 34  R lateral nostril from base of nose to medial tragus 10.4  L lateral nostril from base of nose to medial tragus 10.4  R corner of mouth to where ear lobe meets face 9.5  L corner  of mouth to where ear lobe meets face 9.2  Chin strap tragus to tragus 25     (Blank rows=not tested)   OTHER SYMPTOMS: Pain No Fibrosis No Pitting edema No Infections No Decreased scar mobility Yes  PALPATION: Minimal fat pad and soft tissue at the chin incision. Mild signs of lymphedema anterior neck but not pitting or soft. Tightness may be more present than lymphedema.    TODAY"S TREATMENT: 12/31/22 [name] anterior approach handout as follows (omit 4, 6, 7) : short neck, 5 diaphragmatic breaths, bilateral axillary nodes, bilateral pectoral nodes, anterior chest, short neck, posterior neck moving fluid towards pathway aimed at lateral neck, then lateral and anterior neck moving fluid towards pathway aimed at lateral neck then retracing all steps  STM bil neck: UT, LS, anterior neck, suboccipitals, submental region and scar mobility with moderate pressure   12/29/22 Showed pt compression options and pt opted for [facility] which she will order on [employer] size medium - we discussed putting silicone on scar under compression trial Reviewed self MLD in seated in front of the mirror then PT performed all steps in supine [name] anterior approach handout as follows (omit 4, 6, 7) : short neck, 5 diaphragmatic breaths, bilateral axillary nodes, bilateral pectoral nodes, anterior chest, short neck, posterior neck moving fluid towards pathway aimed at lateral neck, then lateral and anterior neck moving fluid towards pathway aimed at lateral neck then retracing all steps  STM bil neck: UT, LS, anterior neck, suboccipitals, submental region and scar mobility with gentle pressure for the first day.   EVAL Supine HOB elevated with 1 pillow: PT performed and explained principles of MLD and lymphedema post radiation  Peformed modified anterior norton per below due to no LN removed and pt size of chest  PATIENT EDUCATION:  Education details: briefly anterior norton modified, POC  Person educated:  Patient Education method: Explanation, Demonstration, Tactile cues, Verbal cues, and Handouts Education comprehension: verbalized understanding and needs further education  HOME EXERCISE PROGRAM: MLD  ASSESSMENT:  CLINICAL IMPRESSION: Continued POC with focus on MLD for the anterior neck and soft tissue work to the chin and neck.   Pt will benefit from skilled therapeutic intervention to improve on the following deficits: decreased knowledge of condition, decreased knowledge of use of DME, increased edema, and increased fascial restrictions  PT treatment/interventions: ADL/Self care home management, Therapeutic exercises, Therapeutic activity, Neuromuscular re-education, Patient/Family education, Self Care, DME instructions, Manual lymph drainage, Compression bandaging, scar mobilization, Taping, Manual therapy, and Re-evaluation   GOALS Name Target  Date  Goal status  1 Pt will be ind with MLD for the anterior neck and chin 01/21/23 NEW  2 Pt will be ind with scar massage or cupping for the neck and chin area 01/21/23 NEW  3 Pt will report no Rt ear region pain x 1-2 days 01/21/23 NEW  4 Pt will be ind with final HEP 01/21/23 NEW     GOALS: Goals reviewed with patient? Yes  LONG TERM GOALS:  (STG=LTG)   PLAN:  PT FREQUENCY/DURATION: 2x per week x 4 weeks   PLAN FOR NEXT SESSION: anterior norton modified MLD education and performance, (removed steps 4, 6, 7) STM neck, scar mob, asked Dr. Isidore Moos about cupping opinion and she responded that she didn't know much about it, will order Gar Gibbon, PT 12/31/2022, 8:00 AM

## 2023-01-05 ENCOUNTER — Ambulatory Visit: Payer: Medicare PPO | Attending: Radiation Oncology | Admitting: Physical Therapy

## 2023-01-05 ENCOUNTER — Encounter: Payer: Self-pay | Admitting: Physical Therapy

## 2023-01-05 DIAGNOSIS — I890 Lymphedema, not elsewhere classified: Secondary | ICD-10-CM | POA: Insufficient documentation

## 2023-01-05 DIAGNOSIS — Z483 Aftercare following surgery for neoplasm: Secondary | ICD-10-CM

## 2023-01-05 DIAGNOSIS — C44319 Basal cell carcinoma of skin of other parts of face: Secondary | ICD-10-CM

## 2023-01-05 NOTE — Therapy (Signed)
OUTPATIENT PHYSICAL THERAPY HEAD AND NECK TREATMENT   Patient Name: Tonya Delgado MRN: [id number] DOB:[birth year], 67 y.o., female Today's Date: 01/05/2023  END OF SESSION:  PT End of Session - 01/05/23 0859     Visit Number 4    Number of Visits 9    Date for PT Re-Evaluation 01/21/23    Authorization Type ok for 9 visits until 01/21/23    PT Start Time 0804    PT Stop Time 0859    PT Time Calculation (min) 55 min    Activity Tolerance Patient tolerated treatment well    Behavior During Therapy [facility] for tasks assessed/performed               Past Medical History:  Diagnosis Date   Anxiety    Depression    Hyperlipidemia    Hypertension    Pneumonia    Skin 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,bilateral shoulders, chest, and chin   Past Surgical History:  Procedure Laterality Date   ADJACENT TISSUE TRANSFER/TISSUE REARRANGEMENT N/A 01/13/2022   Procedure: Repair of chin with local tissue rearrangement;  Surgeon: [name], MD;  Location: [place];  Service: Plastics;  Laterality: N/A;  1 hr   APPENDECTOMY     CESAREAN SECTION     x 2   CHOLECYSTECTOMY     DG GALL BLADDER     LAPAROSCOPIC HYSTERECTOMY     SKIN FULL THICKNESS GRAFT N/A 12/05/2021   Procedure: local tissue rearrangment, mucosal closure and burring of mandible bone;  Surgeon: [name], MD;  Location: [place];  Service: Plastics;  Laterality: N/A;   Patient Active Problem List   Diagnosis Date Noted   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 Isidore Moos  REFERRING DIAG:  Diagnosis  C44.319 (ICD-10-CM) - Basal cell carcinoma of chin    THERAPY DIAG:  Lymphedema, not elsewhere classified  Aftercare following surgery for neoplasm  Basal cell carcinoma of chin  Rationale for Evaluation and Treatment: Rehabilitation  ONSET DATE: 11/2021  SUBJECTIVE:     SUBJECTIVE STATEMENT:   The seam on the garment feels like it is digging in to my  neck.   PERTINENT HISTORY:  January 2023 [facility] recurrence with MOHS procedure getting all the way to the bone.  Then Dr. Erin Hearing did an initial closure but it popped back open. A few weeks later he reclosed it and it was so tight I could hardly talk. A third time they removed some scar tissue and did a abdominal skin graft. The final day of surgery was 03/16/22.  Radiation completed just to the chin x 6 weeks.  Has a very dry mouth. In 2016 the first cancer at the chin they removed cancer from the skin and pulled the neck skin up to meet the chin.  No LN removed.    PATIENT GOALS:   to be educated about the signs and symptoms of lymphedema and learn post op HEP.   PAIN:  Are you having pain? 0/10  PRECAUTIONS: none  WEIGHT BEARING RESTRICTIONS: No  FALLS:  Has patient fallen in last 6 months? No Does the patient have a fear of falling that limits activity? No Is the patient reluctant to leave the house due to a fear of falling?No  LIVING ENVIRONMENT: Patient lives with: husband  OCCUPATION: retired  LEISURE: knitting, reading, cooking,  PRIOR LEVEL OF FUNCTION: Independent   OBJECTIVE:  COGNITION: Overall  cognitive status: Within functional limits for tasks assessed                  POSTURE:  Forward head and rounded shoulders posture  SHOULDER AROM:   Full and MMT 5/5   CERVICAL AROM:   Percent limited  Flexion 50 - tight posterior neck  Extension 41 - tight front of the neck  Right lateral flexion 40  Left lateral flexion 40  Right rotation 70  Left rotation 70    (Blank rows=not tested)   LYMPHEDEMA ASSESSMENT:   Circumference in cm  4 cm superior to sternal notch around neck 25.9  6 cm superior to sternal notch around neck 34  8 cm superior to sternal notch around neck 34  R lateral nostril from base of nose to medial tragus 10.4  L lateral nostril from base of nose to medial tragus 10.4  R corner of mouth to where ear lobe meets face 9.5  L corner of mouth  to where ear lobe meets face 9.2  Chin strap tragus to tragus 25     (Blank rows=not tested)   OTHER SYMPTOMS: Pain No Fibrosis No Pitting edema No Infections No Decreased scar mobility Yes  PALPATION: Minimal fat pad and soft tissue at the chin incision. Mild signs of lymphedema anterior neck but not pitting or soft. Tightness may be more present than lymphedema.    TODAY"S TREATMENT: 01/05/23 [name] anterior approach handout as follows (omit 4, 6, 7) : short neck, 5 diaphragmatic breaths, bilateral axillary nodes, bilateral pectoral nodes, anterior chest, short neck, posterior neck moving fluid towards pathway aimed at lateral neck, then lateral and anterior neck moving fluid towards pathway aimed at lateral neck then retracing all steps  STM bil neck: UT, LS, anterior neck, suboccipitals, submental region and scar mobility with moderate pressure  Cut piece of 1/4 inch foam and placed in stockinette and added to anterior neck with marena garment - made adjustments to garment to help it stay on better since pt cut strap over back of head  12/31/22 [name] anterior approach handout as follows (omit 4, 6, 7) : short neck, 5 diaphragmatic breaths, bilateral axillary nodes, bilateral pectoral nodes, anterior chest, short neck, posterior neck moving fluid towards pathway aimed at lateral neck, then lateral and anterior neck moving fluid towards pathway aimed at lateral neck then retracing all steps  STM bil neck: UT, LS, anterior neck, suboccipitals, submental region and scar mobility with moderate pressure   12/29/22 Showed pt compression options and pt opted for [facility] which she will order on [employer] size medium - we discussed putting silicone on scar under compression trial Reviewed self MLD in seated in front of the mirror then PT performed all steps in supine [name] anterior approach handout as follows (omit 4, 6, 7) : short neck, 5 diaphragmatic breaths, bilateral axillary nodes, bilateral  pectoral nodes, anterior chest, short neck, posterior neck moving fluid towards pathway aimed at lateral neck, then lateral and anterior neck moving fluid towards pathway aimed at lateral neck then retracing all steps  STM bil neck: UT, LS, anterior neck, suboccipitals, submental region and scar mobility with gentle pressure for the first day.   EVAL Supine HOB elevated with 1 pillow: PT performed and explained principles of MLD and lymphedema post radiation  Peformed modified anterior norton per below due to no LN removed and pt size of chest  PATIENT EDUCATION:  Education details: briefly anterior norton modified, POC  Person educated: Patient Education  method: Explanation, Demonstration, Tactile cues, Verbal cues, and Handouts Education comprehension: verbalized understanding and needs further education  HOME EXERCISE PROGRAM: MLD  ASSESSMENT:  CLINICAL IMPRESSION: Continued POC with focus on MLD for the anterior neck and soft tissue work to the chin and neck. Added 1/4 in foam to anterior neck to help decrease pressure from seam and to improve comfort. Educated pt on importance of compression and MLD and how both are equally important to management of lymphedema.   Pt will benefit from skilled therapeutic intervention to improve on the following deficits: decreased knowledge of condition, decreased knowledge of use of DME, increased edema, and increased fascial restrictions  PT treatment/interventions: ADL/Self care home management, Therapeutic exercises, Therapeutic activity, Neuromuscular re-education, Patient/Family education, Self Care, DME instructions, Manual lymph drainage, Compression bandaging, scar mobilization, Taping, Manual therapy, and Re-evaluation   GOALS Name Target Date  Goal status  1 Pt will be ind with MLD for the anterior neck and chin 01/21/23 NEW  2 Pt will be ind with scar massage or cupping for the neck and chin area 01/21/23 NEW  3 Pt will report no Rt ear  region pain x 1-2 days 01/21/23 NEW  4 Pt will be ind with final HEP 01/21/23 NEW     GOALS: Goals reviewed with patient? Yes  LONG TERM GOALS:  (STG=LTG)   PLAN:  PT FREQUENCY/DURATION: 2x per week x 4 weeks   PLAN FOR NEXT SESSION: anterior norton modified MLD education and performance, (removed steps 4, 6, 7) STM neck, scar mob, asked Dr. Isidore Moos about cupping opinion and she responded that she didn't know much about it, will order Allegra Grana, PT 01/05/2023, 9:02 AM

## 2023-01-07 ENCOUNTER — Ambulatory Visit: Payer: Medicare PPO | Admitting: Rehabilitation

## 2023-01-07 ENCOUNTER — Encounter: Payer: Self-pay | Admitting: Rehabilitation

## 2023-01-07 DIAGNOSIS — I890 Lymphedema, not elsewhere classified: Secondary | ICD-10-CM

## 2023-01-07 DIAGNOSIS — Z483 Aftercare following surgery for neoplasm: Secondary | ICD-10-CM

## 2023-01-07 DIAGNOSIS — C44319 Basal cell carcinoma of skin of other parts of face: Secondary | ICD-10-CM

## 2023-01-07 NOTE — Therapy (Signed)
OUTPATIENT PHYSICAL THERAPY HEAD AND NECK TREATMENT   Patient Name: Tonya Delgado MRN: [id number] DOB:Jul 20, 1956, 67 y.o., female Today's Date: 01/07/2023  END OF SESSION:  PT End of Session - 01/07/23 0800     Visit Number 4    Number of Visits 9    Date for PT Re-Evaluation 01/21/23    Authorization - Visit Number 4    Authorization - Number of Visits 9    PT Start Time 0800    PT Stop Time 0855    PT Time Calculation (min) 55 min    Activity Tolerance Patient tolerated treatment well    Behavior During Therapy WFL for tasks assessed/performed               Past Medical History:  Diagnosis Date   Anxiety    Depression    Hyperlipidemia    Hypertension    Pneumonia    Skin 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,bilateral shoulders, chest, and chin   Past Surgical History:  Procedure Laterality Date   ADJACENT TISSUE TRANSFER/TISSUE REARRANGEMENT N/A 01/13/2022   Procedure: Repair of chin with local tissue rearrangement;  Surgeon: [name], MD;  Location: [place];  Service: Plastics;  Laterality: N/A;  1 hr   APPENDECTOMY     CESAREAN SECTION     x 2   CHOLECYSTECTOMY     DG GALL BLADDER     LAPAROSCOPIC HYSTERECTOMY     SKIN FULL THICKNESS GRAFT N/A 12/05/2021   Procedure: local tissue rearrangment, mucosal closure and burring of mandible bone;  Surgeon: [name], MD;  Location: [place];  Service: Plastics;  Laterality: N/A;   Patient Active Problem List   Diagnosis Date Noted   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 Isidore Moos  REFERRING DIAG:  Diagnosis  C44.319 (ICD-10-CM) - Basal cell carcinoma of chin    THERAPY DIAG:  Lymphedema, not elsewhere classified  Aftercare following surgery for neoplasm  Basal cell carcinoma of chin  Rationale for Evaluation and Treatment: Rehabilitation  ONSET DATE: 11/2021  SUBJECTIVE:     SUBJECTIVE STATEMENT: The foam is working well.  Today my  neck feels swollen and fat.   PERTINENT HISTORY:  January 2023 [facility] recurrence with MOHS procedure getting all the way to the bone.  Then Dr. Erin Hearing did an initial closure but it popped back open. A few weeks later he reclosed it and it was so tight I could hardly talk. A third time they removed some scar tissue and did a abdominal skin graft. The final day of surgery was 03/16/22.  Radiation completed just to the chin x 6 weeks.  Has a very dry mouth. In 2016 the first cancer at the chin they removed cancer from the skin and pulled the neck skin up to meet the chin.  No LN removed.    PATIENT GOALS:   to be educated about the signs and symptoms of lymphedema and learn post op HEP.   PAIN:  Are you having pain? 0/10  PRECAUTIONS: none  WEIGHT BEARING RESTRICTIONS: No  FALLS:  Has patient fallen in last 6 months? No Does the patient have a fear of falling that limits activity? No Is the patient reluctant to leave the house due to a fear of falling?No  LIVING ENVIRONMENT: Patient lives with: husband  OCCUPATION: retired  Psychologist, clinical: knitting, reading, cooking,  PRIOR LEVEL OF FUNCTION: Independent   OBJECTIVE:  COGNITION: Overall cognitive status: Within functional limits for tasks assessed                  POSTURE:  Forward head and rounded shoulders posture  SHOULDER AROM:   Full and MMT 5/5   CERVICAL AROM:   Percent limited  Flexion 50 - tight posterior neck  Extension 41 - tight front of the neck  Right lateral flexion 40  Left lateral flexion 40  Right rotation 70  Left rotation 70    (Blank rows=not tested)   LYMPHEDEMA ASSESSMENT:   Circumference in cm  4 cm superior to sternal notch around neck 25.9  6 cm superior to sternal notch around neck 34  8 cm superior to sternal notch around neck 34  R lateral nostril from base of nose to medial tragus 10.4  L lateral nostril from base of nose to medial tragus 10.4  R corner of mouth to where ear lobe meets face  9.5  L corner of mouth to where ear lobe meets face 9.2  Chin strap tragus to tragus 25     (Blank rows=not tested)   OTHER SYMPTOMS: Pain No Fibrosis No Pitting edema No Infections No Decreased scar mobility Yes  PALPATION: Minimal fat pad and soft tissue at the chin incision. Mild signs of lymphedema anterior neck but not pitting or soft. Tightness may be more present than lymphedema.    TODAY"S TREATMENT: 01/07/23 [name] anterior approach handout as follows (omit 4, 6, 7) : short neck, 5 diaphragmatic breaths, bilateral axillary nodes, bilateral pectoral nodes, anterior chest, short neck, posterior neck moving fluid towards pathway aimed at lateral neck, then lateral and anterior neck moving fluid towards pathway aimed at lateral neck then retracing all steps  STM bil neck: UT, LS, anterior neck, suboccipitals, submental region and scar mobility with moderate pressure   01/05/23 [name] anterior approach handout as follows (omit 4, 6, 7) : short neck, 5 diaphragmatic breaths, bilateral axillary nodes, bilateral pectoral nodes, anterior chest, short neck, posterior neck moving fluid towards pathway aimed at lateral neck, then lateral and anterior neck moving fluid towards pathway aimed at lateral neck then retracing all steps  STM bil neck: UT, LS, anterior neck, suboccipitals, submental region and scar mobility with moderate pressure  Cut piece of 1/4 inch foam and placed in stockinette and added to anterior neck with marena garment - made adjustments to garment to help it stay on better since pt cut strap over back of head  12/31/22 [name] anterior approach handout as follows (omit 4, 6, 7) : short neck, 5 diaphragmatic breaths, bilateral axillary nodes, bilateral pectoral nodes, anterior chest, short neck, posterior neck moving fluid towards pathway aimed at lateral neck, then lateral and anterior neck moving fluid towards pathway aimed at lateral neck then retracing all steps  STM bil  neck: UT, LS, anterior neck, suboccipitals, submental region and scar mobility with moderate pressure   PATIENT EDUCATION:  Education details: briefly anterior norton modified, POC  Person educated: Patient Education method: Explanation, Demonstration, Tactile cues, Verbal cues, and Handouts Education comprehension: verbalized understanding and needs further education  HOME EXERCISE PROGRAM: MLD  ASSESSMENT:  CLINICAL IMPRESSION: Continued POC with focus on MLD for the anterior neck and soft tissue work to the chin and neck.   Pt will benefit from skilled therapeutic intervention to improve on the following deficits: decreased knowledge of condition, decreased knowledge of use of DME, increased edema, and increased fascial restrictions  PT treatment/interventions: ADL/Self care  home management, Therapeutic exercises, Therapeutic activity, Neuromuscular re-education, Patient/Family education, Self Care, DME instructions, Manual lymph drainage, Compression bandaging, scar mobilization, Taping, Manual therapy, and Re-evaluation   GOALS Name Target Date  Goal status  1 Pt will be ind with MLD for the anterior neck and chin 01/21/23 NEW  2 Pt will be ind with scar massage or cupping for the neck and chin area 01/21/23 NEW  3 Pt will report no Rt ear region pain x 1-2 days 01/21/23 NEW  4 Pt will be ind with final HEP 01/21/23 NEW     GOALS: Goals reviewed with patient? Yes  LONG TERM GOALS:  (STG=LTG)   PLAN:  PT FREQUENCY/DURATION: 2x per week x 4 weeks   PLAN FOR NEXT SESSION: anterior norton modified MLD education and performance, (removed steps 4, 6, 7) STM neck, scar mob, asked Dr. Isidore Moos about cupping opinion and she responded that she didn't know much about it, will order Gar Gibbon, PT 01/07/2023, 8:58 AM

## 2023-01-12 ENCOUNTER — Ambulatory Visit: Payer: Medicare PPO | Admitting: Physical Therapy

## 2023-01-12 ENCOUNTER — Encounter: Payer: Self-pay | Admitting: Physical Therapy

## 2023-01-12 DIAGNOSIS — I890 Lymphedema, not elsewhere classified: Secondary | ICD-10-CM

## 2023-01-12 DIAGNOSIS — Z483 Aftercare following surgery for neoplasm: Secondary | ICD-10-CM

## 2023-01-12 DIAGNOSIS — C44319 Basal cell carcinoma of skin of other parts of face: Secondary | ICD-10-CM

## 2023-01-12 NOTE — Therapy (Signed)
OUTPATIENT PHYSICAL THERAPY HEAD AND NECK TREATMENT   Patient Name: Tonya Delgado MRN: [id number] DOB:October 29, 1956, 67 y.o., female Today's Date: 01/12/2023  END OF SESSION:  PT End of Session - 01/12/23 0806     Visit Number 5    Number of Visits 9    Date for PT Re-Evaluation 01/21/23    Authorization Type ok for 9 visits until 01/21/23    PT Start Time 0805    PT Stop Time 0851    PT Time Calculation (min) 46 min    Activity Tolerance Patient tolerated treatment well    Behavior During Therapy [facility] for tasks assessed/performed               Past Medical History:  Diagnosis Date   Anxiety    Depression    Hyperlipidemia    Hypertension    Pneumonia    Skin 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,bilateral shoulders, chest, and chin   Past Surgical History:  Procedure Laterality Date   ADJACENT TISSUE TRANSFER/TISSUE REARRANGEMENT N/A 01/13/2022   Procedure: Repair of chin with local tissue rearrangement;  Surgeon: [name], MD;  Location: [place];  Service: Plastics;  Laterality: N/A;  1 hr   APPENDECTOMY     CESAREAN SECTION     x 2   CHOLECYSTECTOMY     DG GALL BLADDER     LAPAROSCOPIC HYSTERECTOMY     SKIN FULL THICKNESS GRAFT N/A 12/05/2021   Procedure: local tissue rearrangment, mucosal closure and burring of mandible bone;  Surgeon: [name], MD;  Location: [place];  Service: Plastics;  Laterality: N/A;   Patient Active Problem List   Diagnosis Date Noted   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 Isidore Moos  REFERRING DIAG:  Diagnosis  C44.319 (ICD-10-CM) - Basal cell carcinoma of chin    THERAPY DIAG:  Lymphedema, not elsewhere classified  Aftercare following surgery for neoplasm  Basal cell carcinoma of chin  Rationale for Evaluation and Treatment: Rehabilitation  ONSET DATE: 11/2021  SUBJECTIVE:     SUBJECTIVE STATEMENT: The swelling is doing better but my neck is still sore.    PERTINENT HISTORY:  January 2023 [facility] recurrence with MOHS procedure getting all the way to the bone.  Then Dr. Erin Hearing did an initial closure but it popped back open. A few weeks later he reclosed it and it was so tight I could hardly talk. A third time they removed some scar tissue and did a abdominal skin graft. The final day of surgery was 03/16/22.  Radiation completed just to the chin x 6 weeks.  Has a very dry mouth. In 2016 the first cancer at the chin they removed cancer from the skin and pulled the neck skin up to meet the chin.  No LN removed.    PATIENT GOALS:   to be educated about the signs and symptoms of lymphedema and learn post op HEP.   PAIN:  Are you having pain? 0/10  PRECAUTIONS: none  WEIGHT BEARING RESTRICTIONS: No  FALLS:  Has patient fallen in last 6 months? No Does the patient have a fear of falling that limits activity? No Is the patient reluctant to leave the house due to a fear of falling?No  LIVING ENVIRONMENT: Patient lives with: husband  OCCUPATION: retired  LEISURE: knitting, reading, cooking,  PRIOR LEVEL OF FUNCTION: Independent   OBJECTIVE:  COGNITION: Overall cognitive status: Within functional limits  for tasks assessed                  POSTURE:  Forward head and rounded shoulders posture  SHOULDER AROM:   Full and MMT 5/5   CERVICAL AROM:   Percent limited  Flexion 50 - tight posterior neck  Extension 41 - tight front of the neck  Right lateral flexion 40  Left lateral flexion 40  Right rotation 70  Left rotation 70    (Blank rows=not tested)   LYMPHEDEMA ASSESSMENT:   Circumference in cm  4 cm superior to sternal notch around neck 25.9  6 cm superior to sternal notch around neck 34  8 cm superior to sternal notch around neck 34  R lateral nostril from base of nose to medial tragus 10.4  L lateral nostril from base of nose to medial tragus 10.4  R corner of mouth to where ear lobe meets face 9.5  L corner of mouth to  where ear lobe meets face 9.2  Chin strap tragus to tragus 25     (Blank rows=not tested)   OTHER SYMPTOMS: Pain No Fibrosis No Pitting edema No Infections No Decreased scar mobility Yes  PALPATION: Minimal fat pad and soft tissue at the chin incision. Mild signs of lymphedema anterior neck but not pitting or soft. Tightness may be more present than lymphedema.    TODAY"S TREATMENT: 01/12/23 [name] anterior approach handout as follows (omit 4, 6, 7) : short neck, 5 diaphragmatic breaths, bilateral axillary nodes, bilateral pectoral nodes, anterior chest, short neck, posterior neck moving fluid towards pathway aimed at lateral neck, then lateral and anterior neck moving fluid towards pathway aimed at lateral neck then retracing all steps  STM bil neck: UT, LS, anterior neck, suboccipitals, submental region and scar mobility with moderate pressure  MFR: gentle MFR to chin with increased mobility of tissue noted afterwards  01/07/23 [name] anterior approach handout as follows (omit 4, 6, 7) : short neck, 5 diaphragmatic breaths, bilateral axillary nodes, bilateral pectoral nodes, anterior chest, short neck, posterior neck moving fluid towards pathway aimed at lateral neck, then lateral and anterior neck moving fluid towards pathway aimed at lateral neck then retracing all steps  STM bil neck: UT, LS, anterior neck, suboccipitals, submental region and scar mobility with moderate pressure   01/05/23 [name] anterior approach handout as follows (omit 4, 6, 7) : short neck, 5 diaphragmatic breaths, bilateral axillary nodes, bilateral pectoral nodes, anterior chest, short neck, posterior neck moving fluid towards pathway aimed at lateral neck, then lateral and anterior neck moving fluid towards pathway aimed at lateral neck then retracing all steps  STM bil neck: UT, LS, anterior neck, suboccipitals, submental region and scar mobility with moderate pressure  Cut piece of 1/4 inch foam and placed in  stockinette and added to anterior neck with marena garment - made adjustments to garment to help it stay on better since pt cut strap over back of head  12/31/22 [name] anterior approach handout as follows (omit 4, 6, 7) : short neck, 5 diaphragmatic breaths, bilateral axillary nodes, bilateral pectoral nodes, anterior chest, short neck, posterior neck moving fluid towards pathway aimed at lateral neck, then lateral and anterior neck moving fluid towards pathway aimed at lateral neck then retracing all steps  STM bil neck: UT, LS, anterior neck, suboccipitals, submental region and scar mobility with moderate pressure   PATIENT EDUCATION:  Education details: briefly anterior norton modified, POC  Person educated: Patient Education method: Explanation, Demonstration, Tactile  cues, Verbal cues, and Handouts Education comprehension: verbalized understanding and needs further education  HOME EXERCISE PROGRAM: MLD  ASSESSMENT:  CLINICAL IMPRESSION: Began gentle MFR to chin today with improved tissue mobility noted afterwards. She reports she was able to close her mouth more easily and pronounce the letter "F" which she normally has difficulty with. She may benefit from work on inside of mouth to continue to release scar tissue.   Pt will benefit from skilled therapeutic intervention to improve on the following deficits: decreased knowledge of condition, decreased knowledge of use of DME, increased edema, and increased fascial restrictions  PT treatment/interventions: ADL/Self care home management, Therapeutic exercises, Therapeutic activity, Neuromuscular re-education, Patient/Family education, Self Care, DME instructions, Manual lymph drainage, Compression bandaging, scar mobilization, Taping, Manual therapy, and Re-evaluation   GOALS Name Target Date  Goal status  1 Pt will be ind with MLD for the anterior neck and chin 01/21/23 NEW  2 Pt will be ind with scar massage or cupping for the neck and  chin area 01/21/23 NEW  3 Pt will report no Rt ear region pain x 1-2 days 01/21/23 NEW  4 Pt will be ind with final HEP 01/21/23 NEW     GOALS: Goals reviewed with patient? Yes  LONG TERM GOALS:  (STG=LTG)   PLAN:  PT FREQUENCY/DURATION: 2x per week x 4 weeks   PLAN FOR NEXT SESSION: anterior norton modified MLD education and performance, (removed steps 4, 6, 7) STM neck, scar mob, asked Dr. Isidore Moos about cupping opinion and she responded that she didn't know much about it, will order Allegra Grana, PT 01/12/2023, 8:54 AM

## 2023-01-14 ENCOUNTER — Ambulatory Visit: Payer: Medicare PPO | Admitting: Physical Therapy

## 2023-01-14 ENCOUNTER — Encounter: Payer: Self-pay | Admitting: Physical Therapy

## 2023-01-14 DIAGNOSIS — I890 Lymphedema, not elsewhere classified: Secondary | ICD-10-CM

## 2023-01-14 DIAGNOSIS — C44319 Basal cell carcinoma of skin of other parts of face: Secondary | ICD-10-CM

## 2023-01-14 DIAGNOSIS — Z483 Aftercare following surgery for neoplasm: Secondary | ICD-10-CM

## 2023-01-14 NOTE — Therapy (Signed)
OUTPATIENT PHYSICAL THERAPY HEAD AND NECK TREATMENT   Patient Name: Tonya Delgado MRN: [id number] DOB:1955/12/06, 67 y.o., female Today's Date: 01/14/2023  END OF SESSION:  PT End of Session - 01/14/23 0804     Visit Number 6    Number of Visits 9    Date for PT Re-Evaluation 01/21/23    Authorization Type ok for 9 visits until 01/21/23    PT Start Time 0804    PT Stop Time 0858    PT Time Calculation (min) 54 min    Activity Tolerance Patient tolerated treatment well    Behavior During Therapy [facility] for tasks assessed/performed               Past Medical History:  Diagnosis Date   Anxiety    Depression    Hyperlipidemia    Hypertension    Pneumonia    Skin 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,bilateral shoulders, chest, and chin   Past Surgical History:  Procedure Laterality Date   ADJACENT TISSUE TRANSFER/TISSUE REARRANGEMENT N/A 01/13/2022   Procedure: Repair of chin with local tissue rearrangement;  Surgeon: [name], MD;  Location: [place];  Service: Plastics;  Laterality: N/A;  1 hr   APPENDECTOMY     CESAREAN SECTION     x 2   CHOLECYSTECTOMY     DG GALL BLADDER     LAPAROSCOPIC HYSTERECTOMY     SKIN FULL THICKNESS GRAFT N/A 12/05/2021   Procedure: local tissue rearrangment, mucosal closure and burring of mandible bone;  Surgeon: [name], MD;  Location: [place];  Service: Plastics;  Laterality: N/A;   Patient Active Problem List   Diagnosis Date Noted   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 Isidore Moos  REFERRING DIAG:  Diagnosis  C44.319 (ICD-10-CM) - Basal cell carcinoma of chin    THERAPY DIAG:  Lymphedema, not elsewhere classified  Aftercare following surgery for neoplasm  Basal cell carcinoma of chin  Rationale for Evaluation and Treatment: Rehabilitation  ONSET DATE: 11/2021  SUBJECTIVE:     SUBJECTIVE STATEMENT: The chin mobilization really helped.   PERTINENT  HISTORY:  January 2023 [facility] recurrence with MOHS procedure getting all the way to the bone.  Then Dr. Erin Hearing did an initial closure but it popped back open. A few weeks later he reclosed it and it was so tight I could hardly talk. A third time they removed some scar tissue and did a abdominal skin graft. The final day of surgery was 03/16/22.  Radiation completed just to the chin x 6 weeks.  Has a very dry mouth. In 2016 the first cancer at the chin they removed cancer from the skin and pulled the neck skin up to meet the chin.  No LN removed.    PATIENT GOALS:   to be educated about the signs and symptoms of lymphedema and learn post op HEP.   PAIN:  Are you having pain? 0/10  PRECAUTIONS: none  WEIGHT BEARING RESTRICTIONS: No  FALLS:  Has patient fallen in last 6 months? No Does the patient have a fear of falling that limits activity? No Is the patient reluctant to leave the house due to a fear of falling?No  LIVING ENVIRONMENT: Patient lives with: husband  OCCUPATION: retired  Psychologist, clinical: knitting, reading, cooking,  PRIOR LEVEL OF FUNCTION: Independent   OBJECTIVE:  COGNITION: Overall cognitive status: Within functional limits for tasks assessed  POSTURE:  Forward head and rounded shoulders posture  SHOULDER AROM:   Full and MMT 5/5   CERVICAL AROM:   Percent limited  Flexion 50 - tight posterior neck  Extension 41 - tight front of the neck  Right lateral flexion 40  Left lateral flexion 40  Right rotation 70  Left rotation 70    (Blank rows=not tested)   LYMPHEDEMA ASSESSMENT:   Circumference in cm  4 cm superior to sternal notch around neck 25.9  6 cm superior to sternal notch around neck 34  8 cm superior to sternal notch around neck 34  R lateral nostril from base of nose to medial tragus 10.4  L lateral nostril from base of nose to medial tragus 10.4  R corner of mouth to where ear lobe meets face 9.5  L corner of mouth to where ear lobe  meets face 9.2  Chin strap tragus to tragus 25     (Blank rows=not tested)   OTHER SYMPTOMS: Pain No Fibrosis No Pitting edema No Infections No Decreased scar mobility Yes  PALPATION: Minimal fat pad and soft tissue at the chin incision. Mild signs of lymphedema anterior neck but not pitting or soft. Tightness may be more present than lymphedema.    TODAY"S TREATMENT: 01/14/23 [name] anterior approach handout as follows (omit 4, 6, 7) : short neck, 5 diaphragmatic breaths, bilateral axillary nodes, bilateral pectoral nodes, anterior chest, short neck, posterior neck moving fluid towards pathway aimed at lateral neck, then lateral and anterior neck moving fluid towards pathway aimed at lateral neck then retracing all steps  MFR: gentle MFR to chin in all directions with increased mobility of tissue noted afterwards  01/12/23 [facility] anterior approach handout as follows (omit 4, 6, 7) : short neck, 5 diaphragmatic breaths, bilateral axillary nodes, bilateral pectoral nodes, anterior chest, short neck, posterior neck moving fluid towards pathway aimed at lateral neck, then lateral and anterior neck moving fluid towards pathway aimed at lateral neck then retracing all steps  STM bil neck: UT, LS, anterior neck, suboccipitals, submental region and scar mobility with moderate pressure  MFR: gentle MFR to chin with increased mobility of tissue noted afterwards  01/07/23 [facility] anterior approach handout as follows (omit 4, 6, 7) : short neck, 5 diaphragmatic breaths, bilateral axillary nodes, bilateral pectoral nodes, anterior chest, short neck, posterior neck moving fluid towards pathway aimed at lateral neck, then lateral and anterior neck moving fluid towards pathway aimed at lateral neck then retracing all steps  STM bil neck: UT, LS, anterior neck, suboccipitals, submental region and scar mobility with moderate pressure   01/05/23 [name] anterior approach handout as follows (omit 4, 6, 7) : short  neck, 5 diaphragmatic breaths, bilateral axillary nodes, bilateral pectoral nodes, anterior chest, short neck, posterior neck moving fluid towards pathway aimed at lateral neck, then lateral and anterior neck moving fluid towards pathway aimed at lateral neck then retracing all steps  STM bil neck: UT, LS, anterior neck, suboccipitals, submental region and scar mobility with moderate pressure  Cut piece of 1/4 inch foam and placed in stockinette and added to anterior neck with marena garment - made adjustments to garment to help it stay on better since pt cut strap over back of head  12/31/22 [name] anterior approach handout as follows (omit 4, 6, 7) : short neck, 5 diaphragmatic breaths, bilateral axillary nodes, bilateral pectoral nodes, anterior chest, short neck, posterior neck moving fluid towards pathway aimed at lateral neck, then lateral and anterior  neck moving fluid towards pathway aimed at lateral neck then retracing all steps  STM bil neck: UT, LS, anterior neck, suboccipitals, submental region and scar mobility with moderate pressure   PATIENT EDUCATION:  Education details: briefly anterior norton modified, POC  Person educated: Patient Education method: Explanation, Demonstration, Tactile cues, Verbal cues, and Handouts Education comprehension: verbalized understanding and needs further education  HOME EXERCISE PROGRAM: MLD  ASSESSMENT:  CLINICAL IMPRESSION: Continued with myofascial release to chin with improvements in mobility noted by end of session. Pt reports it is much easier to close her mouth and make certain sounds when talking. Her lymphedema is also noticeably improved.   Pt will benefit from skilled therapeutic intervention to improve on the following deficits: decreased knowledge of condition, decreased knowledge of use of DME, increased edema, and increased fascial restrictions  PT treatment/interventions: ADL/Self care home management, Therapeutic exercises,  Therapeutic activity, Neuromuscular re-education, Patient/Family education, Self Care, DME instructions, Manual lymph drainage, Compression bandaging, scar mobilization, Taping, Manual therapy, and Re-evaluation   GOALS Name Target Date  Goal status  1 Pt will be ind with MLD for the anterior neck and chin 01/21/23 NEW  2 Pt will be ind with scar massage or cupping for the neck and chin area 01/21/23 NEW  3 Pt will report no Rt ear region pain x 1-2 days 01/21/23 NEW  4 Pt will be ind with final HEP 01/21/23 NEW     GOALS: Goals reviewed with patient? Yes  LONG TERM GOALS:  (STG=LTG)   PLAN:  PT FREQUENCY/DURATION: 2x per week x 4 weeks   PLAN FOR NEXT SESSION: anterior norton modified MLD education and performance, (removed steps 4, 6, 7) STM neck, scar mob, asked Dr. Isidore Moos about cupping opinion and she responded that she didn't know much about it, will order Allegra Grana, PT 01/14/2023, 9:02 AM

## 2023-01-19 ENCOUNTER — Ambulatory Visit: Payer: Medicare PPO | Admitting: Physical Therapy

## 2023-01-19 ENCOUNTER — Encounter: Payer: Self-pay | Admitting: Physical Therapy

## 2023-01-19 DIAGNOSIS — Z483 Aftercare following surgery for neoplasm: Secondary | ICD-10-CM

## 2023-01-19 DIAGNOSIS — I890 Lymphedema, not elsewhere classified: Secondary | ICD-10-CM | POA: Diagnosis not present

## 2023-01-19 DIAGNOSIS — C44319 Basal cell carcinoma of skin of other parts of face: Secondary | ICD-10-CM

## 2023-01-19 NOTE — Therapy (Signed)
OUTPATIENT PHYSICAL THERAPY HEAD AND NECK TREATMENT   Patient Name: Tonya Delgado MRN: [id number] DOB:September 15, 1956, 67 y.o., female Today's Date: 01/19/2023  END OF SESSION:  PT End of Session - 01/19/23 0806     Visit Number 7    Number of Visits 9    Date for PT Re-Evaluation 01/21/23    Authorization Type ok for 9 visits until 01/21/23    PT Start Time 0805    PT Stop Time 0855    PT Time Calculation (min) 50 min    Activity Tolerance Patient tolerated treatment well    Behavior During Therapy [facility] for tasks assessed/performed               Past Medical History:  Diagnosis Date   Anxiety    Depression    Hyperlipidemia    Hypertension    Pneumonia    Skin 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,bilateral shoulders, chest, and chin   Past Surgical History:  Procedure Laterality Date   ADJACENT TISSUE TRANSFER/TISSUE REARRANGEMENT N/A 01/13/2022   Procedure: Repair of chin with local tissue rearrangement;  Surgeon: [name], MD;  Location: [place];  Service: Plastics;  Laterality: N/A;  1 hr   APPENDECTOMY     CESAREAN SECTION     x 2   CHOLECYSTECTOMY     DG GALL BLADDER     LAPAROSCOPIC HYSTERECTOMY     SKIN FULL THICKNESS GRAFT N/A 12/05/2021   Procedure: local tissue rearrangment, mucosal closure and burring of mandible bone;  Surgeon: [name], MD;  Location: [place];  Service: Plastics;  Laterality: N/A;   Patient Active Problem List   Diagnosis Date Noted   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 Isidore Moos  REFERRING DIAG:  Diagnosis  C44.319 (ICD-10-CM) - Basal cell carcinoma of chin    THERAPY DIAG:  Lymphedema, not elsewhere classified  Aftercare following surgery for neoplasm  Basal cell carcinoma of chin  Rationale for Evaluation and Treatment: Rehabilitation  ONSET DATE: 11/2021  SUBJECTIVE:     SUBJECTIVE STATEMENT: The chin feels like it tightens overnight. I did have a  fall. I stood on my husbands leg to hang a wind chime. This is the first day I could wear my watch.   PERTINENT HISTORY:  January 2023 [facility] recurrence with MOHS procedure getting all the way to the bone.  Then Dr. Erin Hearing did an initial closure but it popped back open. A few weeks later he reclosed it and it was so tight I could hardly talk. A third time they removed some scar tissue and did a abdominal skin graft. The final day of surgery was 03/16/22.  Radiation completed just to the chin x 6 weeks.  Has a very dry mouth. In 2016 the first cancer at the chin they removed cancer from the skin and pulled the neck skin up to meet the chin.  No LN removed.    PATIENT GOALS:   to be educated about the signs and symptoms of lymphedema and learn post op HEP.   PAIN:  Are you having pain? 4/10, L wrist, constant, sharp, shooting pain that radiates to collar bone  PRECAUTIONS: none  WEIGHT BEARING RESTRICTIONS: No  FALLS:  Has patient fallen in last 6 months? No Does the patient have a fear of falling that limits activity? No Is the patient reluctant to leave the house due to a fear of  falling?No  LIVING ENVIRONMENT: Patient lives with: husband  OCCUPATION: retired  Psychologist, clinical: knitting, reading, cooking,  PRIOR LEVEL OF FUNCTION: Independent   OBJECTIVE:  COGNITION: Overall cognitive status: Within functional limits for tasks assessed                  POSTURE:  Forward head and rounded shoulders posture  SHOULDER AROM:   Full and MMT 5/5   CERVICAL AROM:   Percent limited  Flexion 50 - tight posterior neck  Extension 41 - tight front of the neck  Right lateral flexion 40  Left lateral flexion 40  Right rotation 70  Left rotation 70    (Blank rows=not tested)   LYMPHEDEMA ASSESSMENT:   Circumference in cm  4 cm superior to sternal notch around neck 25.9  6 cm superior to sternal notch around neck 34  8 cm superior to sternal notch around neck 34  R lateral nostril from  base of nose to medial tragus 10.4  L lateral nostril from base of nose to medial tragus 10.4  R corner of mouth to where ear lobe meets face 9.5  L corner of mouth to where ear lobe meets face 9.2  Chin strap tragus to tragus 25     (Blank rows=not tested)   OTHER SYMPTOMS: Pain No Fibrosis No Pitting edema No Infections No Decreased scar mobility Yes  PALPATION: Minimal fat pad and soft tissue at the chin incision. Mild signs of lymphedema anterior neck but not pitting or soft. Tightness may be more present than lymphedema.    TODAY"S TREATMENT: 01/19/23 [name] anterior approach handout as follows (omit 4, 6, 7) : short neck, 5 diaphragmatic breaths, bilateral axillary nodes, bilateral pectoral nodes, anterior chest, short neck, posterior neck moving fluid towards pathway aimed at lateral neck, then lateral and anterior neck moving fluid towards pathway aimed at lateral neck then retracing all steps  MFR: gentle MFR to chin in all directions with increased mobility of tissue noted afterwards  01/14/23 [facility] anterior approach handout as follows (omit 4, 6, 7) : short neck, 5 diaphragmatic breaths, bilateral axillary nodes, bilateral pectoral nodes, anterior chest, short neck, posterior neck moving fluid towards pathway aimed at lateral neck, then lateral and anterior neck moving fluid towards pathway aimed at lateral neck then retracing all steps  MFR: gentle MFR to chin in all directions with increased mobility of tissue noted afterwards  01/12/23 [facility] anterior approach handout as follows (omit 4, 6, 7) : short neck, 5 diaphragmatic breaths, bilateral axillary nodes, bilateral pectoral nodes, anterior chest, short neck, posterior neck moving fluid towards pathway aimed at lateral neck, then lateral and anterior neck moving fluid towards pathway aimed at lateral neck then retracing all steps  STM bil neck: UT, LS, anterior neck, suboccipitals, submental region and scar mobility with  moderate pressure  MFR: gentle MFR to chin with increased mobility of tissue noted afterwards  01/07/23 [facility] anterior approach handout as follows (omit 4, 6, 7) : short neck, 5 diaphragmatic breaths, bilateral axillary nodes, bilateral pectoral nodes, anterior chest, short neck, posterior neck moving fluid towards pathway aimed at lateral neck, then lateral and anterior neck moving fluid towards pathway aimed at lateral neck then retracing all steps  STM bil neck: UT, LS, anterior neck, suboccipitals, submental region and scar mobility with moderate pressure   01/05/23 [name] anterior approach handout as follows (omit 4, 6, 7) : short neck, 5 diaphragmatic breaths, bilateral axillary nodes, bilateral pectoral nodes, anterior chest, short neck,  posterior neck moving fluid towards pathway aimed at lateral neck, then lateral and anterior neck moving fluid towards pathway aimed at lateral neck then retracing all steps  STM bil neck: UT, LS, anterior neck, suboccipitals, submental region and scar mobility with moderate pressure  Cut piece of 1/4 inch foam and placed in stockinette and added to anterior neck with marena garment - made adjustments to garment to help it stay on better since pt cut strap over back of head  12/31/22 [name] anterior approach handout as follows (omit 4, 6, 7) : short neck, 5 diaphragmatic breaths, bilateral axillary nodes, bilateral pectoral nodes, anterior chest, short neck, posterior neck moving fluid towards pathway aimed at lateral neck, then lateral and anterior neck moving fluid towards pathway aimed at lateral neck then retracing all steps  STM bil neck: UT, LS, anterior neck, suboccipitals, submental region and scar mobility with moderate pressure   PATIENT EDUCATION:  Education details: briefly anterior norton modified, POC  Person educated: Patient Education method: Explanation, Demonstration, Tactile cues, Verbal cues, and Handouts Education comprehension: verbalized  understanding and needs further education  HOME EXERCISE PROGRAM: MLD  ASSESSMENT:  CLINICAL IMPRESSION: Continued with myofascial release to chin with improvements in mobility noted by end of session. Pt had a fall since her last session so advised pt to go to an ortho urgent care to have her wrist assessed as it is bruised with raised areas and she has radiating pain to her collar bone. Her lymphedema is improving as well. She has one more visit prior to her trip to [place].  Pt will benefit from skilled therapeutic intervention to improve on the following deficits: decreased knowledge of condition, decreased knowledge of use of DME, increased edema, and increased fascial restrictions  PT treatment/interventions: ADL/Self care home management, Therapeutic exercises, Therapeutic activity, Neuromuscular re-education, Patient/Family education, Self Care, DME instructions, Manual lymph drainage, Compression bandaging, scar mobilization, Taping, Manual therapy, and Re-evaluation   GOALS Name Target Date  Goal status  1 Pt will be ind with MLD for the anterior neck and chin 01/21/23 NEW  2 Pt will be ind with scar massage or cupping for the neck and chin area 01/21/23 NEW  3 Pt will report no Rt ear region pain x 1-2 days 01/21/23 NEW  4 Pt will be ind with final HEP 01/21/23 NEW     GOALS: Goals reviewed with patient? Yes  LONG TERM GOALS:  (STG=LTG)   PLAN:  PT FREQUENCY/DURATION: 2x per week x 4 weeks   PLAN FOR NEXT SESSION: put on hold after next session, anterior norton modified MLD education and performance, (removed steps 4, 6, 7) STM neck, scar mob, asked Dr. Isidore Moos about cupping opinion and she responded that she didn't know much about it, will order Allegra Grana, PT 01/19/2023, 8:59 AM

## 2023-01-21 ENCOUNTER — Ambulatory Visit: Payer: Medicare PPO | Admitting: Rehabilitation

## 2023-01-21 ENCOUNTER — Encounter: Payer: Self-pay | Admitting: Rehabilitation

## 2023-01-21 DIAGNOSIS — I890 Lymphedema, not elsewhere classified: Secondary | ICD-10-CM

## 2023-01-21 DIAGNOSIS — Z483 Aftercare following surgery for neoplasm: Secondary | ICD-10-CM

## 2023-01-21 DIAGNOSIS — C44319 Basal cell carcinoma of skin of other parts of face: Secondary | ICD-10-CM

## 2023-01-21 NOTE — Therapy (Addendum)
 OUTPATIENT PHYSICAL THERAPY HEAD AND NECK TREATMENT   Patient Name: Tonya Delgado MRN: [id number] DOB:Mar 10, 1956, 67 y.o., female Today's Date: 01/21/2023  END OF SESSION:  PT End of Session - 01/21/23 0803     Visit Number 8    Number of Visits 9    Date for PT Re-Evaluation 01/21/23    PT Start Time 0803    PT Stop Time 0855    PT Time Calculation (min) 52 min    Activity Tolerance Patient tolerated treatment well    Behavior During Therapy [facility] for tasks assessed/performed               Past Medical History:  Diagnosis Date   Anxiety    Depression    Hyperlipidemia    Hypertension    Pneumonia    Skin 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,bilateral shoulders, chest, and chin   Past Surgical History:  Procedure Laterality Date   ADJACENT TISSUE TRANSFER/TISSUE REARRANGEMENT N/A 01/13/2022   Procedure: Repair of chin with local tissue rearrangement;  Surgeon: Janne Napoleon, MD;  Location: [facility];  Service: Plastics;  Laterality: N/A;  1 hr   APPENDECTOMY     CESAREAN SECTION     x 2   CHOLECYSTECTOMY     DG GALL BLADDER     LAPAROSCOPIC HYSTERECTOMY     SKIN FULL THICKNESS GRAFT N/A 12/05/2021   Procedure: local tissue rearrangment, mucosal closure and burring of mandible bone;  Surgeon: Janne Napoleon, MD;  Location: [facility];  Service: Plastics;  Laterality: N/A;   Patient Active Problem List   Diagnosis Date Noted   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 Basilio Cairo  REFERRING DIAG:  Diagnosis  C44.319 (ICD-10-CM) - Basal cell carcinoma of chin    THERAPY DIAG:  Lymphedema, not elsewhere classified  Aftercare following surgery for neoplasm  Basal cell carcinoma of chin  Rationale for Evaluation and Treatment: Rehabilitation  ONSET DATE: 11/2021  SUBJECTIVE:     SUBJECTIVE STATEMENT: The chin feels like it tightens overnight. I did have a fall. I stood on my husbands leg to hang a wind chime.  This is the first day I could wear my watch.   PERTINENT HISTORY:  January 2023 [facility] recurrence with MOHS procedure getting all the way to the bone.  Then Dr. Domenica Reamer did an initial closure but it popped back open. A few weeks later he reclosed it and it was so tight I could hardly talk. A third time they removed some scar tissue and did a abdominal skin graft. The final day of surgery was 03/16/22.  Radiation completed just to the chin x 6 weeks.  Has a very dry mouth. In 2016 the first cancer at the chin they removed cancer from the skin and pulled the neck skin up to meet the chin.  No LN removed.    PATIENT GOALS:   to be educated about the signs and symptoms of lymphedema and learn post op HEP.   PAIN:  Are you having pain? 4/10, L wrist, constant, sharp, shooting pain that radiates to collar bone  PRECAUTIONS: none  WEIGHT BEARING RESTRICTIONS: No  FALLS:  Has patient fallen in last 6 months? No Does the patient have a fear of falling that limits activity? No Is the patient reluctant to leave the house due to a fear of falling?No  LIVING ENVIRONMENT: Patient lives with: husband  OCCUPATION: retired  LEISURE: knitting, reading, cooking,  PRIOR LEVEL OF FUNCTION: Independent   OBJECTIVE:  COGNITION: Overall cognitive status: Within functional limits for tasks assessed                  POSTURE:  Forward head and rounded shoulders posture  SHOULDER AROM:   Full and MMT 5/5   CERVICAL AROM:   Percent limited  Flexion 50 - tight posterior neck  Extension 41 - tight front of the neck  Right lateral flexion 40  Left lateral flexion 40  Right rotation 70  Left rotation 70    (Blank rows=not tested)   LYMPHEDEMA ASSESSMENT:   Circumference in cm   4 cm superior to sternal notch around neck 32 32  6 cm superior to sternal notch around neck 34 33.5  8 cm superior to sternal notch around neck 34 33.5  R lateral nostril from base of nose to medial tragus 10.4   L  lateral nostril from base of nose to medial tragus 10.4   R corner of mouth to where ear lobe meets face 9.5 9.5  L corner of mouth to where ear lobe meets face 9.2 9.2  Chin strap tragus to tragus 25 23      (Blank rows=not tested)   OTHER SYMPTOMS: Pain No Fibrosis No Pitting edema No Infections No Decreased scar mobility Yes  PALPATION: Minimal fat pad and soft tissue at the chin incision. Mild signs of lymphedema anterior neck but not pitting or soft. Tightness may be more present than lymphedema.    TODAY"S TREATMENT: 01/21/23 [name] anterior approach handout as follows (omit 4, 6, 7) : short neck, 5 diaphragmatic breaths, bilateral axillary nodes, bilateral pectoral nodes, anterior chest, short neck, posterior neck moving fluid towards pathway aimed at lateral neck, then lateral and anterior neck moving fluid towards pathway aimed at lateral neck then retracing all steps  MFR: gentle MFR to chin in all directions with increased mobility of tissue noted afterwards  01/19/23 [facility] anterior approach handout as follows (omit 4, 6, 7) : short neck, 5 diaphragmatic breaths, bilateral axillary nodes, bilateral pectoral nodes, anterior chest, short neck, posterior neck moving fluid towards pathway aimed at lateral neck, then lateral and anterior neck moving fluid towards pathway aimed at lateral neck then retracing all steps  MFR: gentle MFR to chin in all directions with increased mobility of tissue noted afterwards  01/14/23 [facility] anterior approach handout as follows (omit 4, 6, 7) : short neck, 5 diaphragmatic breaths, bilateral axillary nodes, bilateral pectoral nodes, anterior chest, short neck, posterior neck moving fluid towards pathway aimed at lateral neck, then lateral and anterior neck moving fluid towards pathway aimed at lateral neck then retracing all steps  MFR: gentle MFR to chin in all directions with increased mobility of tissue noted afterwards  01/12/23 [facility] anterior  approach handout as follows (omit 4, 6, 7) : short neck, 5 diaphragmatic breaths, bilateral axillary nodes, bilateral pectoral nodes, anterior chest, short neck, posterior neck moving fluid towards pathway aimed at lateral neck, then lateral and anterior neck moving fluid towards pathway aimed at lateral neck then retracing all steps  STM bil neck: UT, LS, anterior neck, suboccipitals, submental region and scar mobility with moderate pressure  MFR: gentle MFR to chin with increased mobility of tissue noted afterwards  01/07/23 [facility] anterior approach handout as follows (omit 4, 6, 7) : short neck, 5 diaphragmatic breaths, bilateral axillary nodes, bilateral pectoral nodes, anterior chest, short neck, posterior neck moving fluid  towards pathway aimed at lateral neck, then lateral and anterior neck moving fluid towards pathway aimed at lateral neck then retracing all steps  STM bil neck: UT, LS, anterior neck, suboccipitals, submental region and scar mobility with moderate pressure   01/05/23 [name] anterior approach handout as follows (omit 4, 6, 7) : short neck, 5 diaphragmatic breaths, bilateral axillary nodes, bilateral pectoral nodes, anterior chest, short neck, posterior neck moving fluid towards pathway aimed at lateral neck, then lateral and anterior neck moving fluid towards pathway aimed at lateral neck then retracing all steps  STM bil neck: UT, LS, anterior neck, suboccipitals, submental region and scar mobility with moderate pressure  Cut piece of 1/4 inch foam and placed in stockinette and added to anterior neck with marena garment - made adjustments to garment to help it stay on better since pt cut strap over back of head  12/31/22 [name] anterior approach handout as follows (omit 4, 6, 7) : short neck, 5 diaphragmatic breaths, bilateral axillary nodes, bilateral pectoral nodes, anterior chest, short neck, posterior neck moving fluid towards pathway aimed at lateral neck, then lateral and  anterior neck moving fluid towards pathway aimed at lateral neck then retracing all steps  STM bil neck: UT, LS, anterior neck, suboccipitals, submental region and scar mobility with moderate pressure   PATIENT EDUCATION:  Education details: briefly anterior norton modified, POC  Person educated: Patient Education method: Explanation, Demonstration, Tactile cues, Verbal cues, and Handouts Education comprehension: verbalized understanding and needs further education  HOME EXERCISE PROGRAM: MLD  ASSESSMENT:  CLINICAL IMPRESSION: Continued with myofascial release to chin and MLD.  Improved scar status vs when this PT last saw her.  She feeling good with her MLD and compression and feels like her neck has decreased in size and tightness and she is no longer having ear pain.  She will be going out of town x 2 weeks and will let us know if she needs more when she returns.   Pt will benefit from skilled therapeutic intervention to improve on the following deficits: decreased knowledge of condition, decreased knowledge of use of DME, increased edema, and increased fascial restrictions  PT treatment/interventions: ADL/Self care home management, Therapeutic exercises, Therapeutic activity, Neuromuscular re-education, Patient/Family education, Self Care, DME instructions, Manual lymph drainage, Compression bandaging, scar mobilization, Taping, Manual therapy, and Re-evaluation   GOALS Name Target Date  Goal status  1 Pt will be ind with MLD for the anterior neck and chin 01/21/23 MET  2 Pt will be ind with scar massage or cupping for the neck and chin area 01/21/23 NEW  3 Pt will report no Rt ear region pain x 1-2 days 01/21/23 MET  4 Pt will be ind with final HEP 01/21/23 NEW     GOALS: Goals reviewed with patient? Yes  LONG TERM GOALS:  (STG=LTG)   PLAN:  PT FREQUENCY/DURATION: 2x per week x 4 weeks   PLAN FOR NEXT SESSION: put on hold after next session, anterior norton modified MLD  education and performance, (removed steps 4, 6, 7) STM neck, scar mob, asked Dr. Basilio Cairo about cupping opinion and she responded that she didn't know much about it, will order Morton Stall, PT 01/21/2023, 10:52 AM  PHYSICAL THERAPY DISCHARGE SUMMARY  Visits from Start of Care: 8  Current functional level related to goals / functional outcomes: Per above   Remaining deficits: lymphedema risk   Education / Equipment: Self care HEP   Plan: Patient agrees to discharge.

## 2023-04-06 DIAGNOSIS — L820 Inflamed seborrheic keratosis: Secondary | ICD-10-CM | POA: Diagnosis not present

## 2023-04-06 DIAGNOSIS — L298 Other pruritus: Secondary | ICD-10-CM | POA: Diagnosis not present

## 2023-04-06 DIAGNOSIS — Z789 Other specified health status: Secondary | ICD-10-CM | POA: Diagnosis not present

## 2023-04-06 DIAGNOSIS — Z85828 Personal history of other malignant neoplasm of skin: Secondary | ICD-10-CM | POA: Diagnosis not present

## 2023-04-06 DIAGNOSIS — L821 Other seborrheic keratosis: Secondary | ICD-10-CM | POA: Diagnosis not present

## 2023-04-06 DIAGNOSIS — Z08 Encounter for follow-up examination after completed treatment for malignant neoplasm: Secondary | ICD-10-CM | POA: Diagnosis not present

## 2023-04-06 DIAGNOSIS — L814 Other melanin hyperpigmentation: Secondary | ICD-10-CM | POA: Diagnosis not present

## 2023-04-06 DIAGNOSIS — L538 Other specified erythematous conditions: Secondary | ICD-10-CM | POA: Diagnosis not present

## 2023-04-06 DIAGNOSIS — D225 Melanocytic nevi of trunk: Secondary | ICD-10-CM | POA: Diagnosis not present

## 2023-05-08 ENCOUNTER — Emergency Department (HOSPITAL_COMMUNITY): Payer: Medicare PPO

## 2023-05-08 ENCOUNTER — Encounter (HOSPITAL_COMMUNITY): Payer: Self-pay

## 2023-05-08 ENCOUNTER — Other Ambulatory Visit: Payer: Self-pay

## 2023-05-08 ENCOUNTER — Observation Stay (HOSPITAL_COMMUNITY)
Admission: EM | Admit: 2023-05-08 | Discharge: 2023-05-09 | Disposition: A | Discharge status: Home / Self Care | Payer: Medicare PPO | Attending: Family Medicine | Admitting: Family Medicine

## 2023-05-08 DIAGNOSIS — K922 Gastrointestinal hemorrhage, unspecified: Principal | ICD-10-CM | POA: Diagnosis present

## 2023-05-08 DIAGNOSIS — I10 Essential (primary) hypertension: Secondary | ICD-10-CM | POA: Diagnosis not present

## 2023-05-08 DIAGNOSIS — Z85828 Personal history of other malignant neoplasm of skin: Secondary | ICD-10-CM | POA: Insufficient documentation

## 2023-05-08 DIAGNOSIS — R5383 Other fatigue: Secondary | ICD-10-CM | POA: Diagnosis not present

## 2023-05-08 DIAGNOSIS — K5791 Diverticulosis of intestine, part unspecified, without perforation or abscess with bleeding: Principal | ICD-10-CM

## 2023-05-08 DIAGNOSIS — D62 Acute posthemorrhagic anemia: Secondary | ICD-10-CM | POA: Insufficient documentation

## 2023-05-08 DIAGNOSIS — I700 Atherosclerosis of aorta: Secondary | ICD-10-CM | POA: Diagnosis not present

## 2023-05-08 DIAGNOSIS — I959 Hypotension, unspecified: Secondary | ICD-10-CM | POA: Diagnosis not present

## 2023-05-08 DIAGNOSIS — K5730 Diverticulosis of large intestine without perforation or abscess without bleeding: Secondary | ICD-10-CM | POA: Diagnosis not present

## 2023-05-08 DIAGNOSIS — Z79899 Other long term (current) drug therapy: Secondary | ICD-10-CM | POA: Insufficient documentation

## 2023-05-08 DIAGNOSIS — E876 Hypokalemia: Secondary | ICD-10-CM | POA: Insufficient documentation

## 2023-05-08 DIAGNOSIS — K5790 Diverticulosis of intestine, part unspecified, without perforation or abscess without bleeding: Secondary | ICD-10-CM | POA: Diagnosis not present

## 2023-05-08 DIAGNOSIS — F17210 Nicotine dependence, cigarettes, uncomplicated: Secondary | ICD-10-CM | POA: Insufficient documentation

## 2023-05-08 DIAGNOSIS — K625 Hemorrhage of anus and rectum: Secondary | ICD-10-CM | POA: Diagnosis present

## 2023-05-08 DIAGNOSIS — R55 Syncope and collapse: Secondary | ICD-10-CM | POA: Diagnosis not present

## 2023-05-08 DIAGNOSIS — K551 Chronic vascular disorders of intestine: Secondary | ICD-10-CM | POA: Diagnosis not present

## 2023-05-08 DIAGNOSIS — E278 Other specified disorders of adrenal gland: Secondary | ICD-10-CM | POA: Diagnosis not present

## 2023-05-08 DIAGNOSIS — R58 Hemorrhage, not elsewhere classified: Secondary | ICD-10-CM | POA: Diagnosis not present

## 2023-05-08 LAB — CBC
HCT: 25.8 % — ABNORMAL LOW (ref 36.0–46.0)
Hemoglobin: 8.3 g/dL — ABNORMAL LOW (ref 12.0–15.0)
MCH: 28.8 pg (ref 26.0–34.0)
MCHC: 32.2 g/dL (ref 30.0–36.0)
MCV: 89.6 fL (ref 80.0–100.0)
Platelets: 310 10*3/uL (ref 150–400)
RBC: 2.88 MIL/uL — ABNORMAL LOW (ref 3.87–5.11)
RDW: 13.4 % (ref 11.5–15.5)
WBC: 6.8 10*3/uL (ref 4.0–10.5)
nRBC: 0 % (ref 0.0–0.2)

## 2023-05-08 LAB — COMPREHENSIVE METABOLIC PANEL
ALT: 27 U/L (ref 0–44)
AST: 14 U/L — ABNORMAL LOW (ref 15–41)
Albumin: 2.9 g/dL — ABNORMAL LOW (ref 3.5–5.0)
Alkaline Phosphatase: 41 U/L (ref 38–126)
Anion gap: 7 (ref 5–15)
BUN: 15 mg/dL (ref 8–23)
CO2: 21 mmol/L — ABNORMAL LOW (ref 22–32)
Calcium: 7.4 mg/dL — ABNORMAL LOW (ref 8.9–10.3)
Chloride: 106 mmol/L (ref 98–111)
Creatinine, Ser: 0.62 mg/dL (ref 0.44–1.00)
GFR, Estimated: >60 mL/min (ref >60)
Glucose, Bld: 108 mg/dL — ABNORMAL HIGH (ref 70–99)
Potassium: 3.3 mmol/L — ABNORMAL LOW (ref 3.5–5.1)
Sodium: 134 mmol/L — ABNORMAL LOW (ref 135–145)
Total Bilirubin: 0.2 mg/dL — ABNORMAL LOW (ref 0.3–1.2)
Total Protein: 5.4 g/dL — ABNORMAL LOW (ref 6.5–8.1)

## 2023-05-08 LAB — TYPE AND SCREEN

## 2023-05-08 LAB — PREPARE RBC (CROSSMATCH)

## 2023-05-08 LAB — PROTIME-INR
INR: 1.1 (ref 0.8–1.2)
Prothrombin Time: 14 seconds (ref 11.4–15.2)

## 2023-05-08 LAB — HEMOGLOBIN AND HEMATOCRIT, BLOOD
HCT: 23.4 % — ABNORMAL LOW (ref 36.0–46.0)
HCT: 28.2 % — ABNORMAL LOW (ref 36.0–46.0)
Hemoglobin: 7.7 g/dL — ABNORMAL LOW (ref 12.0–15.0)
Hemoglobin: 9.2 g/dL — ABNORMAL LOW (ref 12.0–15.0)

## 2023-05-08 LAB — BPAM RBC: Unit Type and Rh: 5100

## 2023-05-08 LAB — POC OCCULT BLOOD, ED: Fecal Occult Bld: POSITIVE — AB

## 2023-05-08 LAB — ABO/RH: ABO/RH(D): O POS

## 2023-05-08 MED ORDER — ONDANSETRON HCL 4 MG PO TABS: 4.0000 mg | ORAL_TABLET | Freq: Four times a day (QID) | ORAL | Status: DC | PRN

## 2023-05-08 MED ORDER — SODIUM CHLORIDE 0.9% IV SOLUTION: Freq: Once | INTRAVENOUS | Status: DC

## 2023-05-08 MED ORDER — TRAZODONE HCL 50 MG PO TABS
25.0000 mg | ORAL_TABLET | Freq: Every evening | ORAL | Status: DC | PRN
  Administered 2023-05-08: 25 mg via ORAL
  Filled 2023-05-08: qty 1

## 2023-05-08 MED ORDER — BUSPIRONE HCL 10 MG PO TABS
10.0000 mg | ORAL_TABLET | Freq: Every day | ORAL | Status: DC
  Administered 2023-05-08: 10 mg via ORAL
  Filled 2023-05-08: qty 1

## 2023-05-08 MED ORDER — SERTRALINE HCL 50 MG PO TABS
50.0000 mg | ORAL_TABLET | Freq: Every day | ORAL | Status: DC
  Administered 2023-05-08: 50 mg via ORAL
  Filled 2023-05-08: qty 1

## 2023-05-08 MED ORDER — ONDANSETRON HCL 4 MG/2ML IJ SOLN: 4.0000 mg | Freq: Four times a day (QID) | INTRAMUSCULAR | Status: DC | PRN

## 2023-05-08 MED ORDER — IOHEXOL 350 MG/ML SOLN
100.0000 mL | Freq: Once | INTRAVENOUS | Status: AC | PRN
  Administered 2023-05-08: 100 mL via INTRAVENOUS

## 2023-05-08 MED ORDER — IBUPROFEN 800 MG PO TABS
800.0000 mg | ORAL_TABLET | Freq: Once | ORAL | Status: DC
  Filled 2023-05-08: qty 1

## 2023-05-08 MED ORDER — GABAPENTIN 300 MG PO CAPS
300.0000 mg | ORAL_CAPSULE | Freq: Every evening | ORAL | Status: DC | PRN
  Administered 2023-05-08: 300 mg via ORAL
  Filled 2023-05-08: qty 1

## 2023-05-08 MED ORDER — ACETAMINOPHEN 500 MG PO TABS
500.0000 mg | ORAL_TABLET | Freq: Four times a day (QID) | ORAL | Status: DC | PRN
  Administered 2023-05-08: 500 mg via ORAL
  Filled 2023-05-08: qty 1

## 2023-05-08 MED ORDER — SUMATRIPTAN SUCCINATE 50 MG PO TABS
100.0000 mg | ORAL_TABLET | ORAL | Status: DC | PRN
  Administered 2023-05-09: 100 mg via ORAL
  Filled 2023-05-08 (×2): qty 2

## 2023-05-08 MED ORDER — ALBUTEROL SULFATE (2.5 MG/3ML) 0.083% IN NEBU: 2.5000 mg | INHALATION_SOLUTION | RESPIRATORY_TRACT | Status: DC | PRN

## 2023-05-08 MED ORDER — SODIUM CHLORIDE 0.9 % IV BOLUS
1000.0000 mL | Freq: Once | INTRAVENOUS | Status: AC
  Administered 2023-05-08: 1000 mL via INTRAVENOUS

## 2023-05-08 NOTE — ED Triage Notes (Addendum)
Patient BIB [facility] from home. Has had bright red bloody bowel movements since 1am. Not taking any blood thinners. History of diverticulitis. Had left sided abdominal pain 1 week ago. Feels like she is going to pass out.  EMS 20G IV left AC 4mg  zofran

## 2023-05-08 NOTE — ED Notes (Signed)
ED TO INPATIENT HANDOFF REPORT  Name/Age/Gender Tonya Delgado 67 y.o. female  Code Status    Code Status Orders  (From admission, onward)           Start     Ordered   05/08/23 1534  Full code  Continuous       Question:  By:  Answer:  Consent: discussion documented in EHR   05/08/23 1534           Code Status History     This patient has a current code status but no historical code status.       Home/SNF/Other Home  Chief Complaint Lower GI bleed [K92.2]  Level of Care/Admitting Diagnosis ED Disposition     ED Disposition  Admit   Condition  --   Comment  Hospital Area: [facility] [[id number]]  Level of Care: Progressive [102]  Admit to Progressive based on following criteria: GI, ENDOCRINE disease patients with GI bleeding, acute liver failure or pancreatitis, stable with diabetic ketoacidosis or thyrotoxicosis (hypothyroid) state.  May admit patient to [facility] or [facility] if equivalent level of care is available:: Yes  Covid Evaluation: Asymptomatic - no recent exposure (last 10 days) testing not required  Diagnosis: Lower GI bleed [161096]  Admitting Physician: Maryln Gottron [[id number]]  Attending Physician: Kirby Crigler, MIR [email or web address] [[id number]]  Certification:: I certify this patient will need inpatient services for at least 2 midnights  Estimated Length of Stay: 3          Medical History Past Medical History:  Diagnosis Date   Anxiety    Depression    Hyperlipidemia    Hypertension    Pneumonia    Skin 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,bilateral shoulders, chest, and chin    Allergies No Known Allergies  IV Location/Drains/Wounds Patient Lines/Drains/Airways Status     Active Line/Drains/Airways     Name Placement date Placement time Site Days   Peripheral IV 05/08/23 18 G Right Antecubital 05/08/23  1240  Antecubital  less than 1   Peripheral IV 05/08/23 20 G Left Antecubital 05/08/23  1230  Antecubital   less than 1            Labs/Imaging Results for orders placed or performed during the hospital encounter of 05/08/23 (from the past 48 hour(s))  Type and screen [facility]     Status: None (Preliminary result)   Collection Time: 05/08/23 12:40 PM  Result Value Ref Range   ABO/RH(D) O POS    Antibody Screen NEG    Sample Expiration      05/11/2023,2359 Performed at [facility], [address] [place], [place] [address]    Unit Number [id number]    Blood Component Type RED CELLS,LR    Unit division 00    Status of Unit ALLOCATED    Transfusion Status OK TO TRANSFUSE    Crossmatch Result Compatible   Comprehensive metabolic panel     Status: Abnormal   Collection Time: 05/08/23 12:47 PM  Result Value Ref Range   Sodium 134 (L) 135 - 145 mmol/L   Potassium 3.3 (L) 3.5 - 5.1 mmol/L   Chloride 106 98 - 111 mmol/L   CO2 21 (L) 22 - 32 mmol/L   Glucose, Bld 108 (H) 70 - 99 mg/dL    Comment: Glucose reference range applies only to samples taken after fasting for at least 8 hours.   BUN 15 8 -  23 mg/dL   Creatinine, Ser 1.61 0.44 - 1.00 mg/dL   Calcium 7.4 (L) 8.9 - 10.3 mg/dL   Total Protein 5.4 (L) 6.5 - 8.1 g/dL   Albumin 2.9 (L) 3.5 - 5.0 g/dL   AST 14 (L) 15 - 41 U/L   ALT 27 0 - 44 U/L   Alkaline Phosphatase 41 38 - 126 U/L   Total Bilirubin 0.2 (L) 0.3 - 1.2 mg/dL   GFR, Estimated >09 >60 mL/min    Comment: (NOTE) Calculated using the CKD-EPI Creatinine Equation (2021)    Anion gap 7 5 - 15    Comment: Performed at [facility], [address], [place], [place] [address]  CBC     Status: Abnormal   Collection Time: 05/08/23 12:47 PM  Result Value Ref Range   WBC 6.8 4.0 - 10.5 K/uL   RBC 2.88 (L) 3.87 - 5.11 MIL/uL   Hemoglobin 8.3 (L) 12.0 - 15.0 g/dL   HCT 81.1 (L) 91.4 - 78.2 %   MCV 89.6 80.0 - 100.0 fL   MCH 28.8 26.0 - 34.0 pg   MCHC 32.2 30.0 - 36.0 g/dL   RDW 95.6 21.3 - 08.6 %   Platelets  310 150 - 400 K/uL   nRBC 0.0 0.0 - 0.2 %    Comment: Performed at [facility], [address] [place], [place] [address]  Protime-INR     Status: None   Collection Time: 05/08/23  1:14 PM  Result Value Ref Range   Prothrombin Time 14.0 11.4 - 15.2 seconds   INR 1.1 0.8 - 1.2    Comment: (NOTE) INR goal varies based on device and disease states. Performed at [facility], [address] [place], [place] [address]   POC occult blood, ED Provider will collect     Status: Abnormal   Collection Time: 05/08/23  1:46 PM  Result Value Ref Range   Fecal Occult Bld POSITIVE (A) NEGATIVE  ABO/Rh     Status: None   Collection Time: 05/08/23  2:48 PM  Result Value Ref Range   ABO/RH(D)      O POS Performed at [facility], [address], [place], [place] [address]   Hemoglobin and hematocrit, blood     Status: Abnormal   Collection Time: 05/08/23  2:48 PM  Result Value Ref Range   Hemoglobin 7.7 (L) 12.0 - 15.0 g/dL   HCT 24.4 (L) 01.0 - 27.2 %    Comment: Performed at [facility], [address] [place], [place] [address]  Prepare RBC (crossmatch)     Status: None   Collection Time: 05/08/23  3:06 PM  Result Value Ref Range   Order Confirmation      ORDER PROCESSED BY BLOOD BANK Performed at [facility], [address], [place], [place] [address]    CT Angio Abd/Pel W and/or Wo Contrast  Result Date: 05/08/2023 CLINICAL DATA:  Prior red blood with bowel movements since early this morning. EXAM: CTA ABDOMEN AND PELVIS WITHOUT AND WITH CONTRAST TECHNIQUE: Multidetector CT imaging of the abdomen and pelvis was performed using the standard protocol during bolus administration of intravenous contrast. Multiplanar reconstructed images and MIPs were obtained and reviewed to evaluate the vascular anatomy. RADIATION DOSE REDUCTION: This exam was performed according to the departmental  dose-optimization program which includes automated exposure control, adjustment of the mA and/or kV according to patient size and/or use of iterative reconstruction  technique. CONTRAST:  OMNIPAQUE IOHEXOL 350 MG/ML SOLN COMPARISON:  04/26/2021. FINDINGS: VASCULAR Aorta: Aorta normal in caliber. Moderate atherosclerosis. No dissection. No significant stenosis. Celiac: Small celiac axis without significant stenosis. SMA: Atherosclerosis along the proximal superior mesenteric artery without significant stenosis. Renals: Single right and dual left renal arteries, widely patent. IMA: Patent IMA without significant stenosis. Inflow: Atherosclerosis of both common iliac arteries, greater on the left. No hemodynamically significant stenosis. 60-70% narrowing at the origin of the left internal iliac artery. External iliac arteries widely patent. Proximal Outflow: Bilateral common femoral and visualized portions of the superficial and profunda femoral arteries are patent without evidence of aneurysm, dissection, vasculitis or significant stenosis. Veins: Unremarkable. Review of the MIP images confirms the above findings. NON-VASCULAR Lower chest: No acute abnormality. Hepatobiliary: No focal liver abnormality is seen. Status post cholecystectomy. No biliary dilatation. Pancreas: Unremarkable. No pancreatic ductal dilatation or surrounding inflammatory changes. Spleen: Normal in size without focal abnormality. Adrenals/Urinary Tract: Small stable left adrenal nodule consistent with an adenoma. No follow-up recommended. Normal right adrenal gland. Normal kidneys, ureters and bladder. Stomach/Bowel: There is no contrast extravasation to indicate a GI bleeding source. Multiple colonic diverticula, most numerous along the sigmoid. No diverticulitis. Small bowel and colon are normal in caliber. No wall thickening or inflammation. Normal stomach. Lymphatic: No enlarged lymph nodes. Reproductive: Status post hysterectomy. No  adnexal masses. Other: No abdominal wall hernia or abnormality. No abdominopelvic ascites. Musculoskeletal: No fracture or acute finding.  No bone lesion. IMPRESSION: VASCULAR 1. No evidence of a GI bleeding source. 2. Aortic atherosclerosis without significant stenosis. 3. Small celiac axis, which appears developmental and similar to the prior CT. 4. No hemodynamically significant narrowing of the aortic branch vessels. NON-VASCULAR 1. Multiple colonic diverticula, most evident along the sigmoid. Although no contrast extravasation was noted on the exam, diverticula may be the source of GI bleeding. No evidence of diverticulitis. 2. No acute findings in the abdomen or pelvis. Electronically Signed   By: Amie Portland M.D.   On: 05/08/2023 14:32    Pending Labs Unresulted Labs (From admission, onward)     Start     Ordered   05/09/23 0500  Basic metabolic panel  Tomorrow morning,   R        05/08/23 1534   05/09/23 0500  CBC  Tomorrow morning,   R        05/08/23 1534   05/08/23 1700  Hemoglobin and hematocrit, blood  Now then every 8 hours,   R (with TIMED occurrences)      05/08/23 1534   05/08/23 1533  HIV Antibody (routine testing w rflx)  (HIV Antibody (Routine testing w reflex) panel)  Once,   R        05/08/23 1534            Vitals/Pain Today's Vitals   05/08/23 1227 05/08/23 1229 05/08/23 1315  BP: 135/73  104/69  Pulse: 78  81  Resp: 18  18  Temp: 97.8 F (36.6 C)    TempSrc: Oral    SpO2: 100%  99%  Weight:  55 kg   Height:  5\' 5"  (1.651 m)   PainSc:  0-No pain     Isolation Precautions No active isolations  Medications Medications  0.9 %  sodium chloride infusion (Manually program via Guardrails IV Fluids) (has no administration in time range)  ibuprofen (ADVIL) tablet 800 mg (800 mg Oral Not Given 05/08/23 1528)  SUMAtriptan (IMITREX) tablet 100 mg (has  no administration in time range)  acetaminophen (TYLENOL) tablet 500 mg (has no administration in time range)   gabapentin (NEURONTIN) capsule 300 mg (has no administration in time range)  traZODone (DESYREL) tablet 25 mg (has no administration in time range)  ondansetron (ZOFRAN) tablet 4 mg (has no administration in time range)    Or  ondansetron (ZOFRAN) injection 4 mg (has no administration in time range)  albuterol (PROVENTIL) (2.5 MG/3ML) 0.083% nebulizer solution 2.5 mg (has no administration in time range)  iohexol (OMNIPAQUE) 350 MG/ML injection 100 mL (100 mLs Intravenous Contrast Given 05/08/23 1323)  sodium chloride 0.9 % bolus 1,000 mL (0 mLs Intravenous Stopped 05/08/23 1448)    Mobility non-ambulatory

## 2023-05-08 NOTE — ED Provider Notes (Cosign Needed Addendum)
[facility] AT [facility] Provider Note   CSN: [id number] Arrival date & time: 05/08/23  1222     History  Chief Complaint  Patient presents with   Rectal Bleeding    Tonya Delgado is a 67 y.o. female.  Patient is a 67 year old woman with history significant for diverticulosis, presenting with multiple episodes of rectal bleeding. Patient reports more than 5 episodes of bright red blood and blood clots in her stools since this morning. She has never had similar episodes before. She has felt fatigued, weak, and sweaty today since the episodes began. One episode of vomiting. Denies recent illness, fever, diarrhea.    Rectal Bleeding Associated symptoms: no fever        Home Medications Prior to Admission medications   Medication Sig Start Date End Date Taking? Authorizing Provider  acetaminophen (TYLENOL) 500 MG tablet Take 500 mg by mouth every 6 (six) hours as needed (pain.).    [provider]  atorvastatin (LIPITOR) 10 MG tablet Take 10 mg by mouth every evening.    [provider]  busPIRone (BUSPAR) 10 MG tablet Take 10 mg by mouth at bedtime.    [provider]  Calcium Carb-Cholecalciferol (CALCIUM 600+D3 PO) Take 1 tablet by mouth in the morning.    [provider]  COLLAGEN-VITAMIN C PO Take 2 tablets by mouth in the morning.    [provider]  Cyanocobalamin (VITAMIN B-12) 2500 MCG SUBL Take 2,500 mcg by mouth in the morning.    [provider]  estrogens, conjugated, (PREMARIN) 0.625 MG tablet Take 0.3125 mg by mouth every evening.    [provider]  ferrous sulfate 325 (65 FE) MG EC tablet Take 325 mg by mouth every other day.    [provider]  gabapentin (NEURONTIN) 300 MG capsule Take 300 mg by mouth at bedtime as needed (restless leg syndrome). 10/10/21   [provider]  ibuprofen (ADVIL) 200 MG tablet Take 400 mg by mouth every 8 (eight) hours as  needed (pain.).    [provider]  losartan (COZAAR) 25 MG tablet Take 12.5 mg by mouth every evening.    [provider]  Magnesium 250 MG TABS Take 250 mg by mouth in the morning.    [provider]  Omega-3 Fatty Acids (FISH OIL) 1000 MG CAPS Take 1,000 mg by mouth in the morning.    [provider]  sertraline (ZOLOFT) 50 MG tablet Take 50-75 mg by mouth every evening.    [provider]  SUMAtriptan (IMITREX) 100 MG tablet Take 100 mg by mouth every 2 (two) hours as needed for migraine (max 2 doses/24hrs.). May repeat in 2 hours if headache persists or recurs.    [provider]  traMADol (ULTRAM) 50 MG tablet Take 15 mg by mouth every 6 (six) hours as needed.    [provider]  triamcinolone (NASACORT) 55 MCG/ACT AERO nasal inhaler Place 1 spray into the nose daily.    [provider]      Allergies    Patient has no known allergies.    Review of Systems   Review of Systems  Constitutional:  Positive for diaphoresis and fatigue. Negative for fever.  Gastrointestinal:  Positive for hematochezia.    Physical Exam Updated Vital Signs BP 104/69   Pulse 81   Temp 97.8 F (36.6 C) (Oral)   Resp 18   Ht 5\' 5"  (1.651 m)  Wt 55 kg   SpO2 99%   BMI 20.18 kg/m  Physical Exam Cardiovascular:     Rate and Rhythm: Normal rate and regular rhythm.     Heart sounds: Normal heart sounds.  Pulmonary:     Effort: Pulmonary effort is normal.     Breath sounds: Normal breath sounds.  Abdominal:     General: There is no distension.     Palpations: Abdomen is soft.     Tenderness: There is no abdominal tenderness.  Skin:    General: Skin is warm and dry.  Neurological:     General: No focal deficit present.     Mental Status: She is alert and oriented to person, place, and time.     ED Results / Procedures / Treatments   Labs (all labs ordered are listed, but only abnormal results are displayed) Labs  Reviewed  COMPREHENSIVE METABOLIC PANEL - Abnormal; Notable for the following components:      Result Value   Sodium 134 (*)    Potassium 3.3 (*)    CO2 21 (*)    Glucose, Bld 108 (*)    Calcium 7.4 (*)    Total Protein 5.4 (*)    Albumin 2.9 (*)    AST 14 (*)    Total Bilirubin 0.2 (*)    All other components within normal limits  CBC - Abnormal; Notable for the following components:   RBC 2.88 (*)    Hemoglobin 8.3 (*)    HCT 25.8 (*)    All other components within normal limits  POC OCCULT BLOOD, ED - Abnormal; Notable for the following components:   Fecal Occult Bld POSITIVE (*)    All other components within normal limits  PROTIME-INR  HEMOGLOBIN AND HEMATOCRIT, BLOOD  TYPE AND SCREEN  ABO/RH    EKG None  Radiology CT Angio Abd/Pel W and/or Wo Contrast  Result Date: 05/08/2023 CLINICAL DATA:  Prior red blood with bowel movements since early this morning. EXAM: CTA ABDOMEN AND PELVIS WITHOUT AND WITH CONTRAST TECHNIQUE: Multidetector CT imaging of the abdomen and pelvis was performed using the standard protocol during bolus administration of intravenous contrast. Multiplanar reconstructed images and MIPs were obtained and reviewed to evaluate the vascular anatomy. RADIATION DOSE REDUCTION: This exam was performed according to the departmental dose-optimization program which includes automated exposure control, adjustment of the mA and/or kV according to patient size and/or use of iterative reconstruction technique. CONTRAST:  OMNIPAQUE IOHEXOL 350 MG/ML SOLN COMPARISON:  04/26/2021. FINDINGS: VASCULAR Aorta: Aorta normal in caliber. Moderate atherosclerosis. No dissection. No significant stenosis. Celiac: Small celiac axis without significant stenosis. SMA: Atherosclerosis along the proximal superior mesenteric artery without significant stenosis. Renals: Single right and dual left renal arteries, widely patent. IMA: Patent IMA without significant stenosis. Inflow:  Atherosclerosis of both common iliac arteries, greater on the left. No hemodynamically significant stenosis. 60-70% narrowing at the origin of the left internal iliac artery. External iliac arteries widely patent. Proximal Outflow: Bilateral common femoral and visualized portions of the superficial and profunda femoral arteries are patent without evidence of aneurysm, dissection, vasculitis or significant stenosis. Veins: Unremarkable. Review of the MIP images confirms the above findings. NON-VASCULAR Lower chest: No acute abnormality. Hepatobiliary: No focal liver abnormality is seen. Status post cholecystectomy. No biliary dilatation. Pancreas: Unremarkable. No pancreatic ductal dilatation or surrounding inflammatory changes. Spleen: Normal in size without focal abnormality. Adrenals/Urinary Tract: Small stable left adrenal nodule consistent with an adenoma. No follow-up recommended. Normal right adrenal  gland. Normal kidneys, ureters and bladder. Stomach/Bowel: There is no contrast extravasation to indicate a GI bleeding source. Multiple colonic diverticula, most numerous along the sigmoid. No diverticulitis. Small bowel and colon are normal in caliber. No wall thickening or inflammation. Normal stomach. Lymphatic: No enlarged lymph nodes. Reproductive: Status post hysterectomy. No adnexal masses. Other: No abdominal wall hernia or abnormality. No abdominopelvic ascites. Musculoskeletal: No fracture or acute finding.  No bone lesion. IMPRESSION: VASCULAR 1. No evidence of a GI bleeding source. 2. Aortic atherosclerosis without significant stenosis. 3. Small celiac axis, which appears developmental and similar to the prior CT. 4. No hemodynamically significant narrowing of the aortic branch vessels. NON-VASCULAR 1. Multiple colonic diverticula, most evident along the sigmoid. Although no contrast extravasation was noted on the exam, diverticula may be the source of GI bleeding. No evidence of diverticulitis. 2. No  acute findings in the abdomen or pelvis. Electronically Signed   By: Amie Portland M.D.   On: 05/08/2023 14:32    Procedures Procedures    Medications Ordered in ED Medications  iohexol (OMNIPAQUE) 350 MG/ML injection 100 mL (100 mLs Intravenous Contrast Given 05/08/23 1323)  sodium chloride 0.9 % bolus 1,000 mL (0 mLs Intravenous Stopped 05/08/23 1448)    ED Course/ Medical Decision Making/ A&P                             Medical Decision Making Amount and/or Complexity of Data Reviewed Labs: ordered. Radiology: ordered.  Risk Prescription drug management. Decision regarding hospitalization.  Medical Decision Making:   Tonya Delgado is a 67 y.o. female who presented to the ED today with rectal bleeding detailed above.    Patient's presentation is complicated by their history of diverticulosis.  Complete initial physical exam performed, notably the patient  was normotensive and tender to abdominal palpation without rebound or guarding.    Reviewed and confirmed nursing documentation for past medical history, family history, social history.    Initial Assessment:   With the patient's presentation of rectal bleeding, most likely diagnosis is diverticulosis. Other diagnoses were considered including (but not limited to) AVM, internal/external hemorrhoids, infectious colitis, anal fissure. These are considered less likely due to history of present illness and physical exam findings.     Initial Plan:  Physical exam with stool occult guiac test to evaluate for true rectal bleeding Screening labs including CBC and Metabolic panel to evaluate for infectious or metabolic etiology of disease.  CTA to evaluate for source of GI bleeding Objective evaluation as below reviewed   Initial Study Results:   Laboratory  All laboratory results reviewed without evidence of clinically relevant pathology.    Exceptions include: Hemoglobin : 7.7, initial result in the ED 8.3.  Fecal occult blood:  positive  EKG EKG was reviewed independently. Rate, rhythm, axis, intervals all examined and without medically relevant abnormality. ST segments without concerns for elevations.    Radiology:  All images reviewed independently. Agree with radiology report at this time.   CT Angio Abd/Pel W and/or Wo Contrast  Result Date: 05/08/2023 CLINICAL DATA:  Prior red blood with bowel movements since early this morning. EXAM: CTA ABDOMEN AND PELVIS WITHOUT AND WITH CONTRAST TECHNIQUE: Multidetector CT imaging of the abdomen and pelvis was performed using the standard protocol during bolus administration of intravenous contrast. Multiplanar reconstructed images and MIPs were obtained and reviewed to evaluate the vascular anatomy. RADIATION DOSE REDUCTION: This exam was performed according to  the departmental dose-optimization program which includes automated exposure control, adjustment of the mA and/or kV according to patient size and/or use of iterative reconstruction technique. CONTRAST:  OMNIPAQUE IOHEXOL 350 MG/ML SOLN COMPARISON:  04/26/2021. FINDINGS: VASCULAR Aorta: Aorta normal in caliber. Moderate atherosclerosis. No dissection. No significant stenosis. Celiac: Small celiac axis without significant stenosis. SMA: Atherosclerosis along the proximal superior mesenteric artery without significant stenosis. Renals: Single right and dual left renal arteries, widely patent. IMA: Patent IMA without significant stenosis. Inflow: Atherosclerosis of both common iliac arteries, greater on the left. No hemodynamically significant stenosis. 60-70% narrowing at the origin of the left internal iliac artery. External iliac arteries widely patent. Proximal Outflow: Bilateral common femoral and visualized portions of the superficial and profunda femoral arteries are patent without evidence of aneurysm, dissection, vasculitis or significant stenosis. Veins: Unremarkable. Review of the MIP images confirms the above  findings. NON-VASCULAR Lower chest: No acute abnormality. Hepatobiliary: No focal liver abnormality is seen. Status post cholecystectomy. No biliary dilatation. Pancreas: Unremarkable. No pancreatic ductal dilatation or surrounding inflammatory changes. Spleen: Normal in size without focal abnormality. Adrenals/Urinary Tract: Small stable left adrenal nodule consistent with an adenoma. No follow-up recommended. Normal right adrenal gland. Normal kidneys, ureters and bladder. Stomach/Bowel: There is no contrast extravasation to indicate a GI bleeding source. Multiple colonic diverticula, most numerous along the sigmoid. No diverticulitis. Small bowel and colon are normal in caliber. No wall thickening or inflammation. Normal stomach. Lymphatic: No enlarged lymph nodes. Reproductive: Status post hysterectomy. No adnexal masses. Other: No abdominal wall hernia or abnormality. No abdominopelvic ascites. Musculoskeletal: No fracture or acute finding.  No bone lesion. IMPRESSION: VASCULAR 1. No evidence of a GI bleeding source. 2. Aortic atherosclerosis without significant stenosis. 3. Small celiac axis, which appears developmental and similar to the prior CT. 4. No hemodynamically significant narrowing of the aortic branch vessels. NON-VASCULAR 1. Multiple colonic diverticula, most evident along the sigmoid. Although no contrast extravasation was noted on the exam, diverticula may be the source of GI bleeding. No evidence of diverticulitis. 2. No acute findings in the abdomen or pelvis. Electronically Signed   By: Amie Portland M.D.   On: 05/08/2023 14:32      Consults: Case discussed with Dr. Bosie Clos with GI. Does not think GI scoping will be necessary but will see once patient is admitted.   Reassessment and Plan:   Patient is 67 year old woman with history of diverticulosis presenting with  multiple episodes of rectal bleeding. Fecal occult blood test was positive. Hemoglobin decreased from 8.3 to 7.7 while in  the ED. 1 unit of blood was transfused. CTA showed no evidence of a GI bleeding source, though noted diverticula may be source of GI bleeding. In light of physical exam, history, and imaging, a diverticular bleed is most suspected. Patient will be admitted to hospitalist service.   Care transitioned to hospitalist Dr. Kirby Crigler          Final Clinical Impression(s) / ED Diagnoses Final diagnoses:  Gastrointestinal hemorrhage associated with intestinal diverticulosis    Rx / DC Orders ED Discharge Orders     None         Monna Fam, MD 05/08/23 1518    Monna Fam, MD 05/08/23 1548    Jacalyn Lefevre, MD 05/09/23 [phone]

## 2023-05-08 NOTE — Plan of Care (Signed)

## 2023-05-08 NOTE — H&P (Signed)
History and Physical  Tonya Delgado ZOX:[id number] DOB: 1956-06-06 DOA: 05/08/2023  PCP: Jarrett Soho, PA-C   Chief Complaint: BRBPR   HPI: Tonya Delgado is a 67 y.o. female with medical history significant for hypertension, hyperlipidemia, depression, BCC, and diverticulitis being admitted to the hospital with rectal bleeding suspicious for diverticular bleed.  Patient states she was in her usual state of health until she woke up in the middle of the night and had a couple of loose bowel movements, which is not completely out of character for her.  She once again woke up this morning at about 6:30 AM but noticed this time that there was bright red blood in the bowl.  For the morning, she had probably about 4 more bloody bowel movements, she was feeling dizzy and lightheaded.  At 1 point, she went to have a bowel movement, on her way back to the bed she had a syncopal episode, her husband found her on the ground.  EMS was called.  ED Course: Evaluation in the emergency department notes normal vital signs, though she does have documented hypotension blood pressure 104/69 at 1 point.  She has not been tachycardic.  She has not had any bowel movements since arrival to the ER.  Lab work significant for hemoglobin of 8.3, was normal back in March.  Recheck after 2 hours shows hemoglobin of 7.7, ER provider has ordered 1 unit of blood transfusion, discussed with GI Dr. Bosie Clos and requests hospitalist admission.  Currently the patient is resting comfortably without nausea, or abdominal discomfort.  Review of Systems: Please see HPI for pertinent positives and negatives. A complete 10 system review of systems are otherwise negative.  Past Medical History:  Diagnosis Date   Anxiety    Depression    Hyperlipidemia    Hypertension    Pneumonia    Skin 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,bilateral shoulders, chest, and chin   Past Surgical History:  Procedure Laterality Date   ADJACENT TISSUE  TRANSFER/TISSUE REARRANGEMENT N/A 01/13/2022   Procedure: Repair of chin with local tissue rearrangement;  Surgeon: Janne Napoleon, MD;  Location: [facility] OR;  Service: Plastics;  Laterality: N/A;  1 hr   APPENDECTOMY     CESAREAN SECTION     x 2   CHOLECYSTECTOMY     DG GALL BLADDER     LAPAROSCOPIC HYSTERECTOMY     SKIN FULL THICKNESS GRAFT N/A 12/05/2021   Procedure: local tissue rearrangment, mucosal closure and burring of mandible bone;  Surgeon: Janne Napoleon, MD;  Location: [facility] OR;  Service: Plastics;  Laterality: N/A;    Social History:  reports that she has been smoking cigarettes. She has been smoking an average of .5 packs per day. She has never used smokeless tobacco. She reports current alcohol use. She reports that she does not currently use drugs.   No Known Allergies  Family History  Problem Relation Age of Onset   Hyperlipidemia Mother    Hypertension Mother    Lung cancer Father    Heart failure Father    Hypertension Sister    Lung cancer Sister    Hypertension Brother      Prior to Admission medications   Medication Sig Start Date End Date Taking? Authorizing Provider  acetaminophen (TYLENOL) 500 MG tablet Take 500 mg by mouth every 6 (six) hours as needed (pain.).    [provider]  atorvastatin (LIPITOR) 10 MG tablet Take 10 mg by mouth every evening.  [provider]  busPIRone (BUSPAR) 10 MG tablet Take 10 mg by mouth at bedtime.    [provider]  Calcium Carb-Cholecalciferol (CALCIUM 600+D3 PO) Take 1 tablet by mouth in the morning.    [provider]  COLLAGEN-VITAMIN C PO Take 2 tablets by mouth in the morning.    [provider]  Cyanocobalamin (VITAMIN B-12) 2500 MCG SUBL Take 2,500 mcg by mouth in the morning.    [provider]  estrogens, conjugated, (PREMARIN) 0.625 MG tablet Take 0.3125 mg by mouth every evening.    [provider]  ferrous sulfate 325 (65 FE) MG EC tablet Take 325 mg  by mouth every other day.    [provider]  gabapentin (NEURONTIN) 300 MG capsule Take 300 mg by mouth at bedtime as needed (restless leg syndrome). 10/10/21   [provider]  ibuprofen (ADVIL) 200 MG tablet Take 400 mg by mouth every 8 (eight) hours as needed (pain.).    [provider]  losartan (COZAAR) 25 MG tablet Take 12.5 mg by mouth every evening.    [provider]  Magnesium 250 MG TABS Take 250 mg by mouth in the morning.    [provider]  Omega-3 Fatty Acids (FISH OIL) 1000 MG CAPS Take 1,000 mg by mouth in the morning.    [provider]  sertraline (ZOLOFT) 50 MG tablet Take 50-75 mg by mouth every evening.    [provider]  SUMAtriptan (IMITREX) 100 MG tablet Take 100 mg by mouth every 2 (two) hours as needed for migraine (max 2 doses/24hrs.). May repeat in 2 hours if headache persists or recurs.    [provider]  traMADol (ULTRAM) 50 MG tablet Take 15 mg by mouth every 6 (six) hours as needed.    [provider]  triamcinolone (NASACORT) 55 MCG/ACT AERO nasal inhaler Place 1 spray into the nose daily.    [provider]    Physical Exam: BP 104/69   Pulse 81   Temp 97.8 F (36.6 C) (Oral)   Resp 18   Ht 5\' 5"  (1.651 m)   Wt 55 kg   SpO2 99%   BMI 20.18 kg/m   General:  Alert, oriented, calm, in no acute distress, husband at the bedside, the patient looks a little pale Eyes: EOMI, clear conjuctivae, white sclerea Neck: supple, no masses, trachea mildline  Cardiovascular: RRR, no murmurs or rubs, no peripheral edema  Respiratory: clear to auscultation bilaterally, no wheezes, no crackles  Abdomen: soft, nontender, nondistended, normal bowel tones heard  Skin: dry, no rashes  Musculoskeletal: no joint effusions, normal range of motion  Psychiatric: appropriate affect, normal speech  Neurologic: extraocular muscles intact, clear speech, moving all extremities with intact  sensorium          Labs on Admission:  Basic Metabolic Panel: Recent Labs  Lab 05/08/23 1247  NA 134*  K 3.3*  CL 106  CO2 21*  GLUCOSE 108*  BUN 15  CREATININE 0.62  CALCIUM 7.4*   Liver Function Tests: Recent Labs  Lab 05/08/23 1247  AST 14*  ALT 27  ALKPHOS 41  BILITOT 0.2*  PROT 5.4*  ALBUMIN 2.9*   No results for input(s): "LIPASE", "AMYLASE" in the last 168 hours. No results for input(s): "AMMONIA" in the last 168 hours. CBC: Recent Labs  Lab 05/08/23 1247 05/08/23 1448  WBC 6.8  --   HGB 8.3* 7.7*  HCT 25.8* 23.4*  MCV 89.6  --  PLT 310  --    Cardiac Enzymes: No results for input(s): "CKTOTAL", "CKMB", "CKMBINDEX", "TROPONINI" in the last 168 hours.  BNP (last 3 results) No results for input(s): "BNP" in the last 8760 hours.  ProBNP (last 3 results) No results for input(s): "PROBNP" in the last 8760 hours.  CBG: No results for input(s): "GLUCAP" in the last 168 hours.  Radiological Exams on Admission: CT Angio Abd/Pel W and/or Wo Contrast  Result Date: 05/08/2023 CLINICAL DATA:  Prior red blood with bowel movements since early this morning. EXAM: CTA ABDOMEN AND PELVIS WITHOUT AND WITH CONTRAST TECHNIQUE: Multidetector CT imaging of the abdomen and pelvis was performed using the standard protocol during bolus administration of intravenous contrast. Multiplanar reconstructed images and MIPs were obtained and reviewed to evaluate the vascular anatomy. RADIATION DOSE REDUCTION: This exam was performed according to the departmental dose-optimization program which includes automated exposure control, adjustment of the mA and/or kV according to patient size and/or use of iterative reconstruction technique. CONTRAST:  OMNIPAQUE IOHEXOL 350 MG/ML SOLN COMPARISON:  04/26/2021. FINDINGS: VASCULAR Aorta: Aorta normal in caliber. Moderate atherosclerosis. No dissection. No significant stenosis. Celiac: Small celiac axis without significant stenosis. SMA:  Atherosclerosis along the proximal superior mesenteric artery without significant stenosis. Renals: Single right and dual left renal arteries, widely patent. IMA: Patent IMA without significant stenosis. Inflow: Atherosclerosis of both common iliac arteries, greater on the left. No hemodynamically significant stenosis. 60-70% narrowing at the origin of the left internal iliac artery. External iliac arteries widely patent. Proximal Outflow: Bilateral common femoral and visualized portions of the superficial and profunda femoral arteries are patent without evidence of aneurysm, dissection, vasculitis or significant stenosis. Veins: Unremarkable. Review of the MIP images confirms the above findings. NON-VASCULAR Lower chest: No acute abnormality. Hepatobiliary: No focal liver abnormality is seen. Status post cholecystectomy. No biliary dilatation. Pancreas: Unremarkable. No pancreatic ductal dilatation or surrounding inflammatory changes. Spleen: Normal in size without focal abnormality. Adrenals/Urinary Tract: Small stable left adrenal nodule consistent with an adenoma. No follow-up recommended. Normal right adrenal gland. Normal kidneys, ureters and bladder. Stomach/Bowel: There is no contrast extravasation to indicate a GI bleeding source. Multiple colonic diverticula, most numerous along the sigmoid. No diverticulitis. Small bowel and colon are normal in caliber. No wall thickening or inflammation. Normal stomach. Lymphatic: No enlarged lymph nodes. Reproductive: Status post hysterectomy. No adnexal masses. Other: No abdominal wall hernia or abnormality. No abdominopelvic ascites. Musculoskeletal: No fracture or acute finding.  No bone lesion. IMPRESSION: VASCULAR 1. No evidence of a GI bleeding source. 2. Aortic atherosclerosis without significant stenosis. 3. Small celiac axis, which appears developmental and similar to the prior CT. 4. No hemodynamically significant narrowing of the aortic branch vessels.  NON-VASCULAR 1. Multiple colonic diverticula, most evident along the sigmoid. Although no contrast extravasation was noted on the exam, diverticula may be the source of GI bleeding. No evidence of diverticulitis. 2. No acute findings in the abdomen or pelvis. Electronically Signed   By: Amie Portland M.D.   On: 05/08/2023 14:32    Assessment/Plan This is a pleasant 67 year old female with a history of diverticulitis, hypertension, depression being admitted to the hospital with painless bright red blood per rectum likely due to a diverticular bleed.  Bright red blood per rectum-highly suspicious for diverticular bleeding.  She is hemodynamically stable, with normal blood pressure and heart rate. -Inpatient admission to progressive unit -Keep n.p.o. for now and avoid all blood thinners -Transfusion as below -GI Dr. Bosie Clos has been  consulted by the ER, and will follow  Acute blood loss anemia-due to lower GI bleeding -Transfuse 1 unit of blood now -Follow every 8 hour hemoglobin  Hypertension-hold home antihypertensives due to relative hypotension  Depression-continue home medications once reconciled  Restless leg-gabapentin nightly as needed  History of migraine-Imitrex as needed  DVT prophylaxis: SCDs    Code Status: Full Code  Consults called: GI Dr. Bosie Clos was contacted by the ER.  Admission status: The appropriate patient status for this patient is INPATIENT. Inpatient status is judged to be reasonable and necessary in order to provide the required intensity of service to ensure the patient's safety. The patient's presenting symptoms, physical exam findings, and initial radiographic and laboratory data in the context of their chronic comorbidities is felt to place them at high risk for further clinical deterioration. Furthermore, it is not anticipated that the patient will be medically stable for discharge from the hospital within 2 midnights of admission.    I certify that at  the point of admission it is my clinical judgment that the patient will require inpatient hospital care spanning beyond 2 midnights from the point of admission due to high intensity of service, high risk for further deterioration and high frequency of surveillance required  Time spent: 51 minutes  Zoiee Wimmer Sharlette Dense MD [facility] Pager [phone]  If 7PM-7AM, please contact night-coverage [email or web address] Password [facility]  05/08/2023, 3:34 PM

## 2023-05-09 DIAGNOSIS — K921 Melena: Secondary | ICD-10-CM | POA: Diagnosis not present

## 2023-05-09 DIAGNOSIS — K922 Gastrointestinal hemorrhage, unspecified: Secondary | ICD-10-CM | POA: Diagnosis not present

## 2023-05-09 LAB — HEMOGLOBIN AND HEMATOCRIT, BLOOD
HCT: 30.8 % — ABNORMAL LOW (ref 36.0–46.0)
HCT: 29.9 % — ABNORMAL LOW (ref 36.0–46.0)
Hemoglobin: 10 g/dL — ABNORMAL LOW (ref 12.0–15.0)
Hemoglobin: 9.7 g/dL — ABNORMAL LOW (ref 12.0–15.0)

## 2023-05-09 LAB — BASIC METABOLIC PANEL
Anion gap: 6 (ref 5–15)
BUN: 8 mg/dL (ref 8–23)
CO2: 25 mmol/L (ref 22–32)
Calcium: 8 mg/dL — ABNORMAL LOW (ref 8.9–10.3)
Chloride: 106 mmol/L (ref 98–111)
Creatinine, Ser: 0.64 mg/dL (ref 0.44–1.00)
GFR, Estimated: >60 mL/min (ref >60)
Glucose, Bld: 93 mg/dL (ref 70–99)
Potassium: 3.3 mmol/L — ABNORMAL LOW (ref 3.5–5.1)
Sodium: 137 mmol/L (ref 135–145)

## 2023-05-09 LAB — CBC
HCT: 30.8 % — ABNORMAL LOW (ref 36.0–46.0)
Hemoglobin: 10 g/dL — ABNORMAL LOW (ref 12.0–15.0)
MCH: 29 pg (ref 26.0–34.0)
MCHC: 32.5 g/dL (ref 30.0–36.0)
MCV: 89.3 fL (ref 80.0–100.0)
Platelets: 275 10*3/uL (ref 150–400)
RBC: 3.45 MIL/uL — ABNORMAL LOW (ref 3.87–5.11)
RDW: 13.8 % (ref 11.5–15.5)
WBC: 6.7 10*3/uL (ref 4.0–10.5)
nRBC: 0 % (ref 0.0–0.2)

## 2023-05-09 LAB — HIV ANTIBODY (ROUTINE TESTING W REFLEX): HIV Screen 4th Generation wRfx: NONREACTIVE

## 2023-05-09 MED ORDER — POTASSIUM CHLORIDE CRYS ER 20 MEQ PO TBCR
40.0000 meq | EXTENDED_RELEASE_TABLET | Freq: Once | ORAL | Status: AC
  Administered 2023-05-09: 40 meq via ORAL
  Filled 2023-05-09: qty 2

## 2023-05-09 NOTE — Care Management CC44 (Signed)
Condition Code 44 Documentation Completed  Patient Details  Name: Tonya Delgado MRN: [id number] Date of Birth: Nov 07, 1955   Condition Code 44 given:   yes Patient signature on Condition Code 44 notice:   yes Documentation of 2 MD's agreement:    Code 44 added to claim:   yes    Carmina Miller, LCSWA 05/09/2023, 2:04 PM

## 2023-05-09 NOTE — Discharge Summary (Signed)
Physician Discharge Summary   Patient: Tonya Delgado MRN: [id number] DOB: 1956-09-06  Admit date:     05/08/2023  Discharge date: 05/09/23  Discharge Physician: Meredeth Ide   PCP: Jarrett Soho, PA-C   Recommendations at discharge:   Follow-up Dr. Marca Ancona, gastroenterology in 4 weeks Follow-up PCP in 1 week  Discharge Diagnoses: Principal Problem:   Lower GI bleed  Resolved Problems:   * No resolved hospital problems. *  Hospital Course: 67 y.o. female with the acute onset of bright red blood per rectum that happened several times at home 2 nights ago without associated N/V/abdominal pain. She felt dizzy and passed out at home. Had one episode of nonbloody vomiting when EMS came and reports one episode of rectal bleeding in the ER but none since then. Felt left sided abdominal pain 2 weeks ago that lasted a couple of days and resolved. CT angiogram NEG for active bleeding. Colonoscopy in 07/2021 that showed left-sided diverticulosis and a tubular adenoma was removed. 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 Hgb 8.3 then 7.7. S/P 1 U PRBCs and Hgb 10 this morning.   Assessment and Plan:  Painless hematochezia -Resolved -Likely diverticular bleeding -Hemoglobin stable at 9.7 -Seen by gastroenterology, no further intervention recommended -Follow-up gastroenterology in 4 weeks, Dr. Marcos Eke  Acute blood loss anemia -Hemoglobin stable at 9.7  Hypokalemia -Potassium is 3.3 -Will replace potassium with K-Dur 40 mg p.o. x 1 before discharge  Hypertension -Resume home medications      Consultants: Gastroenterology Procedures performed:   Disposition: Home Diet recommendation:  Discharge Diet Orders (From admission, onward)     Start     Ordered   05/09/23 0000  Diet - low sodium heart healthy        05/09/23 1345           Regular diet DISCHARGE MEDICATION: Allergies as of 05/09/2023   No Known Allergies      Medication List      STOP taking these medications    ibuprofen 200 MG tablet Commonly known as: ADVIL       TAKE these medications    acetaminophen 500 MG tablet Commonly known as: TYLENOL Take 500 mg by mouth every 6 (six) hours as needed (pain.).   atorvastatin 10 MG tablet Commonly known as: LIPITOR Take 10 mg by mouth every evening.   busPIRone 10 MG tablet Commonly known as: BUSPAR Take 10 mg by mouth at bedtime.   CALCIUM 600+D3 PO Take 1 tablet by mouth daily.   COLLAGEN-VITAMIN C PO Take 2 tablets by mouth daily.   estrogens (conjugated) 0.625 MG tablet Commonly known as: PREMARIN Take 0.3125 mg by mouth every evening.   ferrous sulfate 325 (65 FE) MG EC tablet Take 325 mg by mouth every other day.   Fish Oil 1000 MG Caps Take 1,000 mg by mouth daily.   gabapentin 300 MG capsule Commonly known as: NEURONTIN Take 300 mg by mouth at bedtime as needed (restless leg syndrome per patient).   losartan 25 MG tablet Commonly known as: COZAAR Take 12.5 mg by mouth every evening.   Magnesium 250 MG Tabs Take 250 mg by mouth in the morning.   sertraline 50 MG tablet Commonly known as: ZOLOFT Take 50 mg by mouth every evening.   SUMAtriptan 100 MG tablet Commonly known as: IMITREX Take 100 mg by mouth every 2 (two) hours as needed for migraine (max 2 doses/24hrs.). May repeat in 2 hours if  headache persists or recurs.   traMADol 50 MG tablet Commonly known as: ULTRAM Take 15 mg by mouth every 6 (six) hours as needed.   triamcinolone 55 MCG/ACT Aero nasal inhaler Commonly known as: NASACORT Place 1 spray into the nose daily.   Vitamin B-12 2500 MCG Subl Take 2,500 mcg by mouth daily.        Follow-up Information     Jarrett Soho, PA-C Follow up in 1 week(s).   Specialty: Family Medicine Contact information: [address] [place] [address] [phone]         Kerin Salen, MD Follow up in 4 week(s).   Specialty: Gastroenterology Contact  information: [address] [place] [address] [phone]                Discharge Exam: Ceasar Mons Weights   05/08/23 1229  Weight: 55 kg   General-appears in no acute distress Heart-S1-S2, regular, no murmur auscultated Lungs-clear to auscultation bilaterally, no wheezing or crackles auscultated Abdomen-soft, nontender, no organomegaly Extremities-no edema in the lower extremities Neuro-alert, oriented x3, no focal deficit noted  Condition at discharge: good  The results of significant diagnostics from this hospitalization (including imaging, microbiology, ancillary and laboratory) are listed below for reference.   Imaging Studies: CT Angio Abd/Pel W and/or Wo Contrast  Result Date: 05/08/2023 CLINICAL DATA:  Prior red blood with bowel movements since early this morning. EXAM: CTA ABDOMEN AND PELVIS WITHOUT AND WITH CONTRAST TECHNIQUE: Multidetector CT imaging of the abdomen and pelvis was performed using the standard protocol during bolus administration of intravenous contrast. Multiplanar reconstructed images and MIPs were obtained and reviewed to evaluate the vascular anatomy. RADIATION DOSE REDUCTION: This exam was performed according to the departmental dose-optimization program which includes automated exposure control, adjustment of the mA and/or kV according to patient size and/or use of iterative reconstruction technique. CONTRAST:  OMNIPAQUE IOHEXOL 350 MG/ML SOLN COMPARISON:  04/26/2021. FINDINGS: VASCULAR Aorta: Aorta normal in caliber. Moderate atherosclerosis. No dissection. No significant stenosis. Celiac: Small celiac axis without significant stenosis. SMA: Atherosclerosis along the proximal superior mesenteric artery without significant stenosis. Renals: Single right and dual left renal arteries, widely patent. IMA: Patent IMA without significant stenosis. Inflow: Atherosclerosis of both common iliac arteries, greater on the left. No hemodynamically  significant stenosis. 60-70% narrowing at the origin of the left internal iliac artery. External iliac arteries widely patent. Proximal Outflow: Bilateral common femoral and visualized portions of the superficial and profunda femoral arteries are patent without evidence of aneurysm, dissection, vasculitis or significant stenosis. Veins: Unremarkable. Review of the MIP images confirms the above findings. NON-VASCULAR Lower chest: No acute abnormality. Hepatobiliary: No focal liver abnormality is seen. Status post cholecystectomy. No biliary dilatation. Pancreas: Unremarkable. No pancreatic ductal dilatation or surrounding inflammatory changes. Spleen: Normal in size without focal abnormality. Adrenals/Urinary Tract: Small stable left adrenal nodule consistent with an adenoma. No follow-up recommended. Normal right adrenal gland. Normal kidneys, ureters and bladder. Stomach/Bowel: There is no contrast extravasation to indicate a GI bleeding source. Multiple colonic diverticula, most numerous along the sigmoid. No diverticulitis. Small bowel and colon are normal in caliber. No wall thickening or inflammation. Normal stomach. Lymphatic: No enlarged lymph nodes. Reproductive: Status post hysterectomy. No adnexal masses. Other: No abdominal wall hernia or abnormality. No abdominopelvic ascites. Musculoskeletal: No fracture or acute finding.  No bone lesion. IMPRESSION: VASCULAR 1. No evidence of a GI bleeding source. 2. Aortic atherosclerosis without significant stenosis. 3. Small celiac axis, which appears  developmental and similar to the prior CT. 4. No hemodynamically significant narrowing of the aortic branch vessels. NON-VASCULAR 1. Multiple colonic diverticula, most evident along the sigmoid. Although no contrast extravasation was noted on the exam, diverticula may be the source of GI bleeding. No evidence of diverticulitis. 2. No acute findings in the abdomen or pelvis. Electronically Signed   By: Amie Portland  M.D.   On: 05/08/2023 14:32    Microbiology: No results found for this or any previous visit.  Labs: CBC: Recent Labs  Lab 05/08/23 1247 05/08/23 1448 05/08/23 2307 05/09/23 0550 05/09/23 0637 05/09/23 0911  WBC 6.8  --   --  6.7  --   --   HGB 8.3* 7.7* 9.2* 10.0* 10.0* 9.7*  HCT 25.8* 23.4* 28.2* 30.8* 30.8* 29.9*  MCV 89.6  --   --  89.3  --   --   PLT 310  --   --  275  --   --    Basic Metabolic Panel: Recent Labs  Lab 05/08/23 1247 05/09/23 0550  NA 134* 137  K 3.3* 3.3*  CL 106 106  CO2 21* 25  GLUCOSE 108* 93  BUN 15 8  CREATININE 0.62 0.64  CALCIUM 7.4* 8.0*   Liver Function Tests: Recent Labs  Lab 05/08/23 1247  AST 14*  ALT 27  ALKPHOS 41  BILITOT 0.2*  PROT 5.4*  ALBUMIN 2.9*   CBG: No results for input(s): "GLUCAP" in the last 168 hours.  Discharge time spent: greater than 30 minutes.  Signed: Meredeth Ide, MD [facility] 05/09/2023

## 2023-05-09 NOTE — Consult Note (Signed)
Referring Provider: Dr. Sharl Ma Primary Care Physician:  Jarrett Soho, PA-C Primary Gastroenterologist:  Dr. Marca Ancona  Reason for Consultation:  Rectal bleeding  HPI: Tonya Delgado is a 67 y.o. female with the acute onset of bright red blood per rectum that happened several times at home 2 nights ago without associated N/V/abdominal pain. She felt dizzy and passed out at home. Had one episode of nonbloody vomiting when EMS came and reports one episode of rectal bleeding in the ER but none since then. Felt left sided abdominal pain 2 weeks ago that lasted a couple of days and resolved. CT angiogram NEG for active bleeding. Colonoscopy in 07/2021 that showed left-sided diverticulosis and a tubular adenoma was removed. 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 Hgb 8.3 then 7.7. S/P 1 U PRBCs and Hgb 10 this morning. Feels ok and wants to go home.  Past Medical History:  Diagnosis Date   Anxiety    Depression    Hyperlipidemia    Hypertension    Pneumonia    Skin 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,bilateral shoulders, chest, and chin    Past Surgical History:  Procedure Laterality Date   ADJACENT TISSUE TRANSFER/TISSUE REARRANGEMENT N/A 01/13/2022   Procedure: Repair of chin with local tissue rearrangement;  Surgeon: Janne Napoleon, MD;  Location: [facility] OR;  Service: Plastics;  Laterality: N/A;  1 hr   APPENDECTOMY     CESAREAN SECTION     x 2   CHOLECYSTECTOMY     DG GALL BLADDER     LAPAROSCOPIC HYSTERECTOMY     SKIN FULL THICKNESS GRAFT N/A 12/05/2021   Procedure: local tissue rearrangment, mucosal closure and burring of mandible bone;  Surgeon: Janne Napoleon, MD;  Location: [facility] OR;  Service: Plastics;  Laterality: N/A;    Prior to Admission medications   Medication Sig Start Date End Date Taking? Authorizing Provider  acetaminophen (TYLENOL) 500 MG tablet Take 500 mg by mouth every 6 (six) hours as needed (pain.).   Yes [provider]   atorvastatin (LIPITOR) 10 MG tablet Take 10 mg by mouth every evening.   Yes [provider]  busPIRone (BUSPAR) 10 MG tablet Take 10 mg by mouth at bedtime.   Yes [provider]  Calcium Carb-Cholecalciferol (CALCIUM 600+D3 PO) Take 1 tablet by mouth daily.   Yes [provider]  COLLAGEN-VITAMIN C PO Take 2 tablets by mouth daily.   Yes [provider]  Cyanocobalamin (VITAMIN B-12) 2500 MCG SUBL Take 2,500 mcg by mouth daily.   Yes [provider]  estrogens, conjugated, (PREMARIN) 0.625 MG tablet Take 0.3125 mg by mouth every evening.   Yes [provider]  ferrous sulfate 325 (65 FE) MG EC tablet Take 325 mg by mouth every other day.   Yes [provider]  gabapentin (NEURONTIN) 300 MG capsule Take 300 mg by mouth at bedtime as needed (restless leg syndrome per patient). 10/10/21  Yes [provider]  ibuprofen (ADVIL) 200 MG tablet Take 400 mg by mouth every 8 (eight) hours as needed (pain.).   Yes [provider]  losartan (COZAAR) 25 MG tablet Take 12.5 mg by mouth every evening.   Yes [provider]  Magnesium 250 MG TABS Take 250 mg by mouth in the morning.   Yes [provider]  Omega-3 Fatty Acids (FISH OIL) 1000 MG CAPS Take 1,000 mg by mouth daily.   Yes [provider]  sertraline (  ZOLOFT) 50 MG tablet Take 50 mg by mouth every evening.   Yes [provider]  SUMAtriptan (IMITREX) 100 MG tablet Take 100 mg by mouth every 2 (two) hours as needed for migraine (max 2 doses/24hrs.). May repeat in 2 hours if headache persists or recurs.   Yes [provider]  triamcinolone (NASACORT) 55 MCG/ACT AERO nasal inhaler Place 1 spray into the nose daily.   Yes [provider]  traMADol (ULTRAM) 50 MG tablet Take 15 mg by mouth every 6 (six) hours as needed. Patient not taking: Reported on 05/08/2023    [provider]    Scheduled Meds:  sodium  chloride   Intravenous Once   busPIRone  10 mg Oral QHS   ibuprofen  800 mg Oral Once   sertraline  50 mg Oral QHS   Continuous Infusions: PRN Meds:.acetaminophen, albuterol, gabapentin, ondansetron **OR** ondansetron (ZOFRAN) IV, SUMAtriptan, traZODone  Allergies as of 05/08/2023   (No Known Allergies)    Family History  Problem Relation Age of Onset   Hyperlipidemia Mother    Hypertension Mother    Lung cancer Father    Heart failure Father    Hypertension Sister    Lung cancer Sister    Hypertension Brother     Social History   Socioeconomic History   Marital status: Married    Spouse name: Not on file   Number of children: 3   Years of education: Not on file   Highest education level: Not on file  Occupational History   Not on file  Tobacco Use   Smoking status: Some Days    Packs/day: .5    Types: Cigarettes   Smokeless tobacco: Never  Vaping Use   Vaping Use: Never used  Substance and Sexual Activity   Alcohol use: Yes    Comment: ocassionally   Drug use: Not Currently   Sexual activity: Not Currently    Birth control/protection: Surgical  Other Topics Concern   Not on file  Social History Narrative   Not on file   Social Determinants of Health   Financial Resource Strain: Not on file  Food Insecurity: No Food Insecurity (05/08/2023)   Hunger Vital Sign    Worried About Running Out of Food in the Last Year: Never true    Ran Out of Food in the Last Year: Never true  Transportation Needs: No Transportation Needs (05/08/2023)   PRAPARE - Administrator, Civil Service (Medical): No    Lack of Transportation (Non-Medical): No  Physical Activity: Not on file  Stress: Not on file  Social Connections: Not on file  Intimate Partner Violence: Not At Risk (05/08/2023)   Humiliation, Afraid, Rape, and Kick questionnaire    Fear of Current or Ex-Partner: No    Emotionally Abused: No    Physically Abused: No    Sexually Abused: No    Review of  Systems: All negative except as stated above in HPI.  Physical Exam: Vital signs: Vitals:   05/09/23 0134 05/09/23 0412  BP: (!) 108/57 113/63  Pulse: 73 74  Resp: 16 16  Temp: 98.2 F (36.8 C) 98 F (36.7 C)  SpO2: 98% 97%   Last BM Date : 05/08/23 General:   Lethargic, thin, pleasant and cooperative in NAD Head: normocephalic, atraumatic Eyes: anicteric sclera ENT: oropharynx clear Neck: supple, nontender Lungs:  Clear throughout to auscultation.   No wheezes, crackles, or rhonchi. No acute distress. Heart:  Regular rate and rhythm; no  murmurs, clicks, rubs,  or gallops. Abdomen: LLQ tenderness with minimal guarding, soft, nondistended, +BS  Rectal:  Deferred Ext: no edema  GI:  Lab Results: Recent Labs    05/08/23 1247 05/08/23 1448 05/08/23 2307 05/09/23 0550 05/09/23 0637  WBC 6.8  --   --  6.7  --   HGB 8.3*   < > 9.2* 10.0* 10.0*  HCT 25.8*   < > 28.2* 30.8* 30.8*  PLT 310  --   --  275  --    < > = values in this interval not displayed.   BMET Recent Labs    05/08/23 1247 05/09/23 0550  NA 134* 137  K 3.3* 3.3*  CL 106 106  CO2 21* 25  GLUCOSE 108* 93  BUN 15 8  CREATININE 0.62 0.64  CALCIUM 7.4* 8.0*   LFT Recent Labs    05/08/23 1247  PROT 5.4*  ALBUMIN 2.9*  AST 14*  ALT 27  ALKPHOS 41  BILITOT 0.2*   PT/INR Recent Labs    05/08/23 1314  LABPROT 14.0  INR 1.1     Studies/Results: CT Angio Abd/Pel W and/or Wo Contrast  Result Date: 05/08/2023 CLINICAL DATA:  Prior red blood with bowel movements since early this morning. EXAM: CTA ABDOMEN AND PELVIS WITHOUT AND WITH CONTRAST TECHNIQUE: Multidetector CT imaging of the abdomen and pelvis was performed using the standard protocol during bolus administration of intravenous contrast. Multiplanar reconstructed images and MIPs were obtained and reviewed to evaluate the vascular anatomy. RADIATION DOSE REDUCTION: This exam was performed according to the departmental dose-optimization  program which includes automated exposure control, adjustment of the mA and/or kV according to patient size and/or use of iterative reconstruction technique. CONTRAST:  OMNIPAQUE IOHEXOL 350 MG/ML SOLN COMPARISON:  04/26/2021. FINDINGS: VASCULAR Aorta: Aorta normal in caliber. Moderate atherosclerosis. No dissection. No significant stenosis. Celiac: Small celiac axis without significant stenosis. SMA: Atherosclerosis along the proximal superior mesenteric artery without significant stenosis. Renals: Single right and dual left renal arteries, widely patent. IMA: Patent IMA without significant stenosis. Inflow: Atherosclerosis of both common iliac arteries, greater on the left. No hemodynamically significant stenosis. 60-70% narrowing at the origin of the left internal iliac artery. External iliac arteries widely patent. Proximal Outflow: Bilateral common femoral and visualized portions of the superficial and profunda femoral arteries are patent without evidence of aneurysm, dissection, vasculitis or significant stenosis. Veins: Unremarkable. Review of the MIP images confirms the above findings. NON-VASCULAR Lower chest: No acute abnormality. Hepatobiliary: No focal liver abnormality is seen. Status post cholecystectomy. No biliary dilatation. Pancreas: Unremarkable. No pancreatic ductal dilatation or surrounding inflammatory changes. Spleen: Normal in size without focal abnormality. Adrenals/Urinary Tract: Small stable left adrenal nodule consistent with an adenoma. No follow-up recommended. Normal right adrenal gland. Normal kidneys, ureters and bladder. Stomach/Bowel: There is no contrast extravasation to indicate a GI bleeding source. Multiple colonic diverticula, most numerous along the sigmoid. No diverticulitis. Small bowel and colon are normal in caliber. No wall thickening or inflammation. Normal stomach. Lymphatic: No enlarged lymph nodes. Reproductive: Status post hysterectomy. No adnexal masses.  Other: No abdominal wall hernia or abnormality. No abdominopelvic ascites. Musculoskeletal: No fracture or acute finding.  No bone lesion. IMPRESSION: VASCULAR 1. No evidence of a GI bleeding source. 2. Aortic atherosclerosis without significant stenosis. 3. Small celiac axis, which appears developmental and similar to the prior CT. 4. No hemodynamically significant narrowing of the aortic branch vessels. NON-VASCULAR 1. Multiple colonic diverticula, most evident along the sigmoid.  Although no contrast extravasation was noted on the exam, diverticula may be the source of GI bleeding. No evidence of diverticulitis. 2. No acute findings in the abdomen or pelvis. Electronically Signed   By: Amie Portland M.D.   On: 05/08/2023 14:32    Impression/Plan: Painless hematochezia likely diverticular that has resolved. CTA negative for active bleeding. No signs of ongoing bleeding. Advance diet. Ok to go home this afternoon if stable. Defer to Dr. Marca Ancona whether to repeat colonoscopy this year or not. Will need f/u with Dr. Marca Ancona in 4-6 weeks.    LOS: 1 day   Shirley Friar  05/09/2023, 9:44 AM  Questions please call [phone]

## 2023-05-09 NOTE — Progress Notes (Signed)
Pt discharged to home at this time. Prior to DC , IV and tele was removed. Pt was given DC paperwork regarding medications, appointments, and condition. Pt verbalized understanding and stated no other concerns at this time. Pt stable at time of DC and left in personal vehicle driven by  husband.

## 2023-05-10 LAB — TYPE AND SCREEN
ABO/RH(D): O POS
Antibody Screen: NEGATIVE
Unit division: 0

## 2023-05-10 LAB — BPAM RBC
Blood Product Expiration Date: 202408012359
ISSUE DATE / TIME: 202407061548

## 2023-05-12 DIAGNOSIS — K5791 Diverticulosis of intestine, part unspecified, without perforation or abscess with bleeding: Secondary | ICD-10-CM | POA: Diagnosis not present

## 2023-05-12 DIAGNOSIS — J439 Emphysema, unspecified: Secondary | ICD-10-CM | POA: Diagnosis not present

## 2023-05-12 DIAGNOSIS — E876 Hypokalemia: Secondary | ICD-10-CM | POA: Diagnosis not present

## 2023-05-12 DIAGNOSIS — D649 Anemia, unspecified: Secondary | ICD-10-CM | POA: Diagnosis not present

## 2023-05-12 DIAGNOSIS — I700 Atherosclerosis of aorta: Secondary | ICD-10-CM | POA: Diagnosis not present

## 2023-05-12 DIAGNOSIS — Z6820 Body mass index (BMI) 20.0-20.9, adult: Secondary | ICD-10-CM | POA: Diagnosis not present

## 2023-05-13 ENCOUNTER — Other Ambulatory Visit: Payer: Self-pay

## 2023-05-13 ENCOUNTER — Observation Stay (HOSPITAL_COMMUNITY)
Admission: EM | Admit: 2023-05-13 | Discharge: 2023-05-15 | Disposition: A | Discharge status: Home / Self Care | Payer: Medicare PPO | Attending: Internal Medicine | Admitting: Internal Medicine

## 2023-05-13 ENCOUNTER — Encounter (HOSPITAL_COMMUNITY): Payer: Self-pay | Admitting: Emergency Medicine

## 2023-05-13 DIAGNOSIS — Z79899 Other long term (current) drug therapy: Secondary | ICD-10-CM | POA: Insufficient documentation

## 2023-05-13 DIAGNOSIS — D649 Anemia, unspecified: Secondary | ICD-10-CM

## 2023-05-13 DIAGNOSIS — Z85828 Personal history of other malignant neoplasm of skin: Secondary | ICD-10-CM | POA: Insufficient documentation

## 2023-05-13 DIAGNOSIS — F17210 Nicotine dependence, cigarettes, uncomplicated: Secondary | ICD-10-CM | POA: Diagnosis not present

## 2023-05-13 DIAGNOSIS — D62 Acute posthemorrhagic anemia: Secondary | ICD-10-CM | POA: Diagnosis not present

## 2023-05-13 DIAGNOSIS — I10 Essential (primary) hypertension: Secondary | ICD-10-CM | POA: Insufficient documentation

## 2023-05-13 DIAGNOSIS — G2581 Restless legs syndrome: Secondary | ICD-10-CM | POA: Diagnosis not present

## 2023-05-13 DIAGNOSIS — K922 Gastrointestinal hemorrhage, unspecified: Principal | ICD-10-CM | POA: Diagnosis present

## 2023-05-13 DIAGNOSIS — K5731 Diverticulosis of large intestine without perforation or abscess with bleeding: Secondary | ICD-10-CM | POA: Diagnosis not present

## 2023-05-13 LAB — COMPREHENSIVE METABOLIC PANEL
ALT: 16 U/L (ref 0–44)
AST: 19 U/L (ref 15–41)
Albumin: 3.2 g/dL — ABNORMAL LOW (ref 3.5–5.0)
Alkaline Phosphatase: 37 U/L — ABNORMAL LOW (ref 38–126)
Anion gap: 6 (ref 5–15)
BUN: 12 mg/dL (ref 8–23)
CO2: 25 mmol/L (ref 22–32)
Calcium: 8.1 mg/dL — ABNORMAL LOW (ref 8.9–10.3)
Chloride: 102 mmol/L (ref 98–111)
Creatinine, Ser: 0.69 mg/dL (ref 0.44–1.00)
GFR, Estimated: >60 mL/min (ref >60)
Glucose, Bld: 95 mg/dL (ref 70–99)
Potassium: 3.8 mmol/L (ref 3.5–5.1)
Sodium: 133 mmol/L — ABNORMAL LOW (ref 135–145)
Total Bilirubin: 0.4 mg/dL (ref 0.3–1.2)
Total Protein: 6 g/dL — ABNORMAL LOW (ref 6.5–8.1)

## 2023-05-13 LAB — CBC WITH DIFFERENTIAL/PLATELET
Abs Immature Granulocytes: 0.04 10*3/uL (ref 0.00–0.07)
Basophils Absolute: 0 10*3/uL (ref 0.0–0.1)
Basophils Relative: 1 %
Eosinophils Absolute: 0.1 10*3/uL (ref 0.0–0.5)
Eosinophils Relative: 2 %
HCT: 21.7 % — ABNORMAL LOW (ref 36.0–46.0)
Hemoglobin: 6.8 g/dL — CL (ref 12.0–15.0)
Immature Granulocytes: 1 %
Lymphocytes Relative: 28 %
Lymphs Abs: 2.2 10*3/uL (ref 0.7–4.0)
MCH: 28.7 pg (ref 26.0–34.0)
MCHC: 31.3 g/dL (ref 30.0–36.0)
MCV: 91.6 fL (ref 80.0–100.0)
Monocytes Absolute: 0.6 10*3/uL (ref 0.1–1.0)
Monocytes Relative: 8 %
Neutro Abs: 4.6 10*3/uL (ref 1.7–7.7)
Neutrophils Relative %: 60 %
Platelets: 379 10*3/uL (ref 150–400)
RBC: 2.37 MIL/uL — ABNORMAL LOW (ref 3.87–5.11)
RDW: 14.1 % (ref 11.5–15.5)
WBC: 7.6 10*3/uL (ref 4.0–10.5)
nRBC: 0 % (ref 0.0–0.2)

## 2023-05-13 LAB — POC OCCULT BLOOD, ED: Fecal Occult Bld: POSITIVE — AB

## 2023-05-13 LAB — PREPARE RBC (CROSSMATCH)

## 2023-05-13 MED ORDER — ACETAMINOPHEN 650 MG RE SUPP: 650.0000 mg | Freq: Four times a day (QID) | RECTAL | Status: DC | PRN

## 2023-05-13 MED ORDER — SERTRALINE HCL 50 MG PO TABS
50.0000 mg | ORAL_TABLET | Freq: Every evening | ORAL | Status: DC
  Administered 2023-05-14 (×2): 50 mg via ORAL
  Filled 2023-05-13 (×2): qty 1

## 2023-05-13 MED ORDER — ACETAMINOPHEN 325 MG PO TABS
650.0000 mg | ORAL_TABLET | Freq: Four times a day (QID) | ORAL | Status: DC | PRN
  Administered 2023-05-13: 650 mg via ORAL
  Filled 2023-05-13: qty 2

## 2023-05-13 MED ORDER — ROPINIROLE HCL 1 MG PO TABS
1.0000 mg | ORAL_TABLET | Freq: Every day | ORAL | Status: DC
  Administered 2023-05-14 (×2): 1 mg via ORAL
  Filled 2023-05-13 (×2): qty 1

## 2023-05-13 MED ORDER — BUSPIRONE HCL 5 MG PO TABS
10.0000 mg | ORAL_TABLET | Freq: Every day | ORAL | Status: DC
  Administered 2023-05-14 (×2): 10 mg via ORAL
  Filled 2023-05-13 (×2): qty 2

## 2023-05-13 MED ORDER — SODIUM CHLORIDE 0.9% IV SOLUTION: Freq: Once | INTRAVENOUS | Status: AC

## 2023-05-13 MED ORDER — LOSARTAN POTASSIUM 25 MG PO TABS
12.5000 mg | ORAL_TABLET | Freq: Every evening | ORAL | Status: DC
  Administered 2023-05-14 (×2): 12.5 mg via ORAL
  Filled 2023-05-13 (×2): qty 1

## 2023-05-13 MED ORDER — ONDANSETRON HCL 4 MG PO TABS: 4.0000 mg | ORAL_TABLET | Freq: Four times a day (QID) | ORAL | Status: DC | PRN

## 2023-05-13 MED ORDER — ONDANSETRON HCL 4 MG/2ML IJ SOLN: 4.0000 mg | Freq: Four times a day (QID) | INTRAMUSCULAR | Status: DC | PRN

## 2023-05-13 MED ORDER — GABAPENTIN 300 MG PO CAPS
300.0000 mg | ORAL_CAPSULE | Freq: Every day | ORAL | Status: DC
  Administered 2023-05-14 (×2): 300 mg via ORAL
  Filled 2023-05-13 (×2): qty 1

## 2023-05-13 MED ORDER — ATORVASTATIN CALCIUM 10 MG PO TABS
10.0000 mg | ORAL_TABLET | Freq: Every evening | ORAL | Status: DC
  Administered 2023-05-14 (×2): 10 mg via ORAL
  Filled 2023-05-13 (×2): qty 1

## 2023-05-13 MED ORDER — ENOXAPARIN SODIUM 40 MG/0.4ML IJ SOSY: 40.0000 mg | PREFILLED_SYRINGE | INTRAMUSCULAR | Status: DC

## 2023-05-13 MED ORDER — POLYETHYLENE GLYCOL 3350 17 GM/SCOOP PO POWD
1.0000 | Freq: Once | ORAL | Status: AC
  Administered 2023-05-14: 255 g via ORAL
  Filled 2023-05-13: qty 255

## 2023-05-13 NOTE — ED Provider Notes (Signed)
[place] EMERGENCY DEPARTMENT AT [facility] Provider Note   CSN: [id number] Arrival date & time: 05/13/23  1709     History  Chief Complaint  Patient presents with   Abnormal Lab    Tonya Delgado is a 67 y.o. female.  Is a 67 year old female with past medical history of hypertension and hyperlipidemia presenting to the emergency department for anemia after recent admission for GI bleed.  She states that over the weekend she was having bright red blood per rectum.  She was admitted to the hospital and was transfused 1 unit of PRBCs.  She states on Monday she did pass a few "old appearing clots" but has not had any bowel movement since Wednesday.  She states that she saw her primary doctor yesterday to have her labs rechecked and she was called today informing her that her hemoglobin was 7 and recommended that she return to the emergency department for reevaluation.  Patient denies any associated abdominal pain.  She states that she feels generally weak and fatigued and has a decreased appetite.  She states that she is still passing gas.  She did not have a colonoscopy during her last hospitalization and was suspected to have a diverticular bleed.  The history is provided by the patient and the spouse.  Abnormal Lab      Home Medications Prior to Admission medications   Medication Sig Start Date End Date Taking? Authorizing Provider  acetaminophen (TYLENOL) 500 MG tablet Take 500 mg by mouth every 6 (six) hours as needed (pain.).    [provider]  atorvastatin (LIPITOR) 10 MG tablet Take 10 mg by mouth every evening.    [provider]  busPIRone (BUSPAR) 10 MG tablet Take 10 mg by mouth at bedtime.    [provider]  Calcium Carb-Cholecalciferol (CALCIUM 600+D3 PO) Take 1 tablet by mouth daily.    [provider]  COLLAGEN-VITAMIN C PO Take 2 tablets by mouth daily.    [provider]  Cyanocobalamin (VITAMIN B-12) 2500  MCG SUBL Take 2,500 mcg by mouth daily.    [provider]  estrogens, conjugated, (PREMARIN) 0.625 MG tablet Take 0.3125 mg by mouth every evening.    [provider]  ferrous sulfate 325 (65 FE) MG EC tablet Take 325 mg by mouth every other day.    [provider]  gabapentin (NEURONTIN) 300 MG capsule Take 300 mg by mouth at bedtime as needed (restless leg syndrome per patient). 10/10/21   [provider]  losartan (COZAAR) 25 MG tablet Take 12.5 mg by mouth every evening.    [provider]  Magnesium 250 MG TABS Take 250 mg by mouth in the morning.    [provider]  Omega-3 Fatty Acids (FISH OIL) 1000 MG CAPS Take 1,000 mg by mouth daily.    [provider]  sertraline (ZOLOFT) 50 MG tablet Take 50 mg by mouth every evening.    [provider]  SUMAtriptan (IMITREX) 100 MG tablet Take 100 mg by mouth every 2 (two) hours as needed for migraine (max 2 doses/24hrs.). May repeat in 2 hours if headache persists or recurs.    [provider]  traMADol (ULTRAM) 50 MG tablet Take 15 mg by mouth every 6 (six) hours as needed. Patient not taking: Reported on 05/08/2023    [provider]  triamcinolone (NASACORT) 55 MCG/ACT AERO nasal inhaler Place 1 spray into the nose daily.    [provider]  Allergies    Patient has no known allergies.    Review of Systems   Review of Systems  Physical Exam Updated Vital Signs BP 115/68 (BP Location: Right Arm)   Pulse 66   Temp (!) 97.5 F (36.4 C) (Oral)   Resp 17   Ht 5\' 5"  (1.651 m)   Wt 55 kg   SpO2 100%   BMI 20.18 kg/m  Physical Exam Vitals and nursing note reviewed.  Constitutional:      General: She is not in acute distress.    Appearance: Normal appearance.  HENT:     Head: Normocephalic and atraumatic.     Nose: Nose normal.     Mouth/Throat:     Mouth: Mucous membranes are moist.     Pharynx: Oropharynx is clear.  Eyes:      Extraocular Movements: Extraocular movements intact.     Conjunctiva/sclera: Conjunctivae normal.  Cardiovascular:     Rate and Rhythm: Normal rate and regular rhythm.     Heart sounds: Normal heart sounds.  Pulmonary:     Effort: Pulmonary effort is normal.     Breath sounds: Normal breath sounds.  Abdominal:     General: Abdomen is flat.     Palpations: Abdomen is soft.     Tenderness: There is no abdominal tenderness.  Musculoskeletal:        General: Normal range of motion.     Cervical back: Normal range of motion.  Skin:    General: Skin is warm and dry.  Neurological:     General: No focal deficit present.     Mental Status: She is alert and oriented to person, place, and time.  Psychiatric:        Mood and Affect: Mood normal.        Behavior: Behavior normal.     ED Results / Procedures / Treatments   Labs (all labs ordered are listed, but only abnormal results are displayed) Labs Reviewed  COMPREHENSIVE METABOLIC PANEL - Abnormal; Notable for the following components:      Result Value   Sodium 133 (*)    Calcium 8.1 (*)    Total Protein 6.0 (*)    Albumin 3.2 (*)    Alkaline Phosphatase 37 (*)    All other components within normal limits  CBC WITH DIFFERENTIAL/PLATELET - Abnormal; Notable for the following components:   RBC 2.37 (*)    Hemoglobin 6.8 (*)    HCT 21.7 (*)    All other components within normal limits  POC OCCULT BLOOD, ED - Abnormal; Notable for the following components:   Fecal Occult Bld POSITIVE (*)    All other components within normal limits  TYPE AND SCREEN  PREPARE RBC (CROSSMATCH)    EKG None  Radiology No results found.  Procedures .Critical Care  Performed by: Rexford Maus, DO Authorized by: Rexford Maus, DO   Critical care provider statement:    Critical care time (minutes):  40   Critical care time was exclusive of:  Separately billable procedures and treating other patients   Critical care was  necessary to treat or prevent imminent or life-threatening deterioration of the following conditions:  Circulatory failure   Critical care was time spent personally by me on the following activities:  Development of treatment plan with patient or surrogate, discussions with consultants, discussions with primary provider, evaluation of patient's response to treatment, examination of patient, obtaining history from patient or surrogate, ordering and performing treatments and  interventions, ordering and review of laboratory studies, ordering and review of radiographic studies, pulse oximetry, re-evaluation of patient's condition and review of old charts   I assumed direction of critical care for this patient from another provider in my specialty: no     Care discussed with: admitting provider       Medications Ordered in ED Medications  0.9 %  sodium chloride infusion (Manually program via Guardrails IV Fluids) ( Intravenous New Bag/Given 05/13/23 2201)    ED Course/ Medical Decision Making/ A&P Clinical Course as of 05/13/23 2212  Thu May 13, 2023  1947 Hgb 6.8 from 9.8 at time of discharge on Sunday. No active bleeding rectally, hemoccult pending. Will be transfused 1 u PRBC and recommended admission for ongoing symptomatic anemia with suspected GIB. [VK]  2128 I spoke with Dr. Lorenso Quarry with [facility] who states they will see in the AM. Recommends stat CT GIB if she develops active bleeding overnight. Patient will be admitted to hospitalist. [VK]    Clinical Course User Index [VK] Rexford Maus, DO                             Medical Decision Making This patient presents to the ED with chief complaint(s) of concern for anemia after recent admission for GI bleed with pertinent past medical history of hypertension, hyperlipidemia which further complicates the presenting complaint. The complaint involves an extensive differential diagnosis and also carries with it a high risk of  complications and morbidity.    The differential diagnosis includes anemia, recurrent GI bleed, coagulopathy, dehydration, electrolyte abnormality  Additional history obtained: Additional history obtained from spouse Records reviewed previous admission documents and Primary Care Documents  ED Course and Reassessment: On patient's arrival to the emergency department she is hemodynamically stable in no acute distress.  She reports no active bleeding at this time.  She will have repeat labs including type and screen as well as a Hemoccult to evaluate for ongoing bleeding and will be closely reassessed.  Independent labs interpretation:  The following labs were independently interpreted: acute anemia 6.8, otherwise within normal range  Independent visualization of imaging: - N/A  Consultation: - Consulted or discussed management/test interpretation w/ external professional: GI, hospitalist  Consideration for admission or further workup: patient requires admission for symptomatic anemia in the setting of GI bleed Social Determinants of health: N/A    Amount and/or Complexity of Data Reviewed Labs: ordered.  Risk Prescription drug management. Decision regarding hospitalization.          Final Clinical Impression(s) / ED Diagnoses Final diagnoses:  Gastrointestinal hemorrhage, unspecified gastrointestinal hemorrhage type  Symptomatic anemia    Rx / DC Orders ED Discharge Orders     None         Rexford Maus, DO 05/13/23 2212

## 2023-05-13 NOTE — ED Notes (Signed)
Pt reports mild headache "6/10". Tylenol administered

## 2023-05-13 NOTE — H&P (Signed)
History and Physical    Tonya Delgado RUE:[id number] DOB: 01/30/1956 DOA: 05/13/2023  PCP: Jarrett Soho, PA-C   Chief Complaint: anemia  HPI: Tonya Delgado is a 67 y.o. female with medical history significant of hypertension, hyperlipidemia, depression who presents emergency department due to concern for GI bleed.  Patient was admitted to the hospital on 7/6 with bright red blood per rectum.  She was dizzy and passed out.  She has a history of diverticulosis and a colonoscopy in 2022.  Her hemoglobin is found transfusion and she was discharged outpatient follow-up.  Unfortunately on 7/8 she had recurrence of her hematochezia.  She continued to feel weak and tired to talk to her gastroenterologist who recommended she come to the ER for lab check.  On arrival to the emergency department she was afebrile hemodynamically stable.  Labs were obtained which revealed hemoglobin 6.8.  Patient was admitted for further workup.  GI was consulted in the emergency department and will see the patient.  On evaluation patient states she has not had a bowel movement in 2 days.  She denies any NSAIDs.  No known history of IBD or cancer.  No black stools melena or hematemesis.  CT scan on 7/6 did demonstrate diverticulosis.   Review of Systems: Review of Systems  All other systems reviewed and are negative.    As per HPI otherwise 10 point review of systems negative.   No Known Allergies  Past Medical History:  Diagnosis Date   Anxiety    Depression    Hyperlipidemia    Hypertension    Pneumonia    Skin 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,bilateral shoulders, chest, and chin    Past Surgical History:  Procedure Laterality Date   ADJACENT TISSUE TRANSFER/TISSUE REARRANGEMENT N/A 01/13/2022   Procedure: Repair of chin with local tissue rearrangement;  Surgeon: Janne Napoleon, MD;  Location: [facility] OR;  Service: Plastics;  Laterality: N/A;  1 hr   APPENDECTOMY     CESAREAN SECTION     x 2    CHOLECYSTECTOMY     DG GALL BLADDER     LAPAROSCOPIC HYSTERECTOMY     SKIN FULL THICKNESS GRAFT N/A 12/05/2021   Procedure: local tissue rearrangment, mucosal closure and burring of mandible bone;  Surgeon: Janne Napoleon, MD;  Location: [facility] OR;  Service: Plastics;  Laterality: N/A;     reports that she has been smoking cigarettes. She has never used smokeless tobacco. She reports current alcohol use. She reports that she does not currently use drugs.  Family History  Problem Relation Age of Onset   Hyperlipidemia Mother    Hypertension Mother    Lung cancer Father    Heart failure Father    Hypertension Sister    Lung cancer Sister    Hypertension Brother     Prior to Admission medications   Medication Sig Start Date End Date Taking? Authorizing Provider  acetaminophen (TYLENOL) 500 MG tablet Take 500 mg by mouth every 6 (six) hours as needed (pain.).    [provider]  atorvastatin (LIPITOR) 10 MG tablet Take 10 mg by mouth every evening.    [provider]  busPIRone (BUSPAR) 10 MG tablet Take 10 mg by mouth at bedtime.    [provider]  Calcium Carb-Cholecalciferol (CALCIUM 600+D3 PO) Take 1 tablet by mouth daily.    [provider]  COLLAGEN-VITAMIN C PO Take 2 tablets by mouth daily.    [provider]  Cyanocobalamin (VITAMIN B-12) 2500 MCG SUBL Take 2,500 mcg by mouth daily.    [provider]  estrogens, conjugated, (PREMARIN) 0.625 MG tablet Take 0.3125 mg by mouth every evening.    [provider]  ferrous sulfate 325 (65 FE) MG EC tablet Take 325 mg by mouth every other day.    [provider]  gabapentin (NEURONTIN) 300 MG capsule Take 300 mg by mouth at bedtime as needed (restless leg syndrome per patient). 10/10/21   [provider]  losartan (COZAAR) 25 MG tablet Take 12.5 mg by mouth every evening.    [provider]  Magnesium 250 MG TABS Take 250 mg by mouth in the morning.     [provider]  Omega-3 Fatty Acids (FISH OIL) 1000 MG CAPS Take 1,000 mg by mouth daily.    [provider]  sertraline (ZOLOFT) 50 MG tablet Take 50 mg by mouth every evening.    [provider]  SUMAtriptan (IMITREX) 100 MG tablet Take 100 mg by mouth every 2 (two) hours as needed for migraine (max 2 doses/24hrs.). May repeat in 2 hours if headache persists or recurs.    [provider]  traMADol (ULTRAM) 50 MG tablet Take 15 mg by mouth every 6 (six) hours as needed. Patient not taking: Reported on 05/08/2023    [provider]  triamcinolone (NASACORT) 55 MCG/ACT AERO nasal inhaler Place 1 spray into the nose daily.    [provider]    Physical Exam: Vitals:   05/13/23 1715 05/13/23 1724 05/13/23 2142 05/13/23 2201  BP: 131/69  115/68   Pulse: 88  66   Resp: 16  17   Temp: 98.7 F (37.1 C)  97.9 F (36.6 C) (!) 97.5 F (36.4 C)  TempSrc: Oral  Oral Oral  SpO2: 96%  100%   Weight:  55 kg    Height:  5\' 5"  (1.651 m)     Physical Exam Constitutional:      Appearance: She is normal weight.  HENT:     Head: Normocephalic.     Nose: Nose normal.     Mouth/Throat:     Mouth: Mucous membranes are moist.     Pharynx: Oropharynx is clear.  Eyes:     Pupils: Pupils are equal, round, and reactive to light.  Cardiovascular:     Rate and Rhythm: Normal rate and regular rhythm.     Pulses: Normal pulses.     Heart sounds: Normal heart sounds.  Pulmonary:     Effort: Pulmonary effort is normal.     Breath sounds: Normal breath sounds.  Abdominal:     General: Abdomen is flat.  Musculoskeletal:        General: Normal range of motion.     Cervical back: Normal range of motion.  Skin:    General: Skin is warm.     Capillary Refill: Capillary refill takes less than 2 seconds.  Neurological:     General: No focal deficit present.     Mental Status: She is alert.  Psychiatric:        Mood and Affect: Mood normal.         Labs on Admission: I have personally reviewed the patients's labs and imaging studies.  Assessment/Plan Principal Problem:   GI bleed   # Acute blood loss anemia most likely secondary to diverticular hemorrhage, POA, active - Patient was recently hospitalized with similar complaints - Patient had continued bleeding while at home -  Hemoglobin dropped to 6 send emergency department - Patient was transfused 1 unit  Plan: Trend hemoglobin every 8 hours Start dose of bowel prep tonight in the event that patient will need colonoscopy GI consulted [facility]D  # RLS-continue gabapentin, start Requip  #Depression-continue Zoloft, buspar  #HLD- continue lipitor  # Hypertension-continue losartan  Admission status: Observation Med-Surg  Certification: The appropriate patient status for this patient is OBSERVATION. Observation status is judged to be reasonable and necessary in order to provide the required intensity of service to ensure the patient's safety. The patient's presenting symptoms, physical exam findings, and initial radiographic and laboratory data in the context of their medical condition is felt to place them at decreased risk for further clinical deterioration. Furthermore, it is anticipated that the patient will be medically stable for discharge from the hospital within 2 midnights of admission.     Alan Mulder MD [facility] If 7PM-7AM, please contact night-coverage [email or web address]  05/13/2023, 10:43 PM

## 2023-05-13 NOTE — ED Triage Notes (Signed)
Pt reports recent admission for GI bleed and discharged Sunday. Hgb Wednesday was 7 at PCP. Reports no BMs since Tuesday early afternoon. Does say BMs on Monday were dark and blood clots. Pt did not have scope while in recent hospital admisstion.

## 2023-05-14 ENCOUNTER — Encounter (HOSPITAL_COMMUNITY): Payer: Self-pay | Admitting: Internal Medicine

## 2023-05-14 DIAGNOSIS — K921 Melena: Secondary | ICD-10-CM | POA: Diagnosis not present

## 2023-05-14 DIAGNOSIS — K922 Gastrointestinal hemorrhage, unspecified: Secondary | ICD-10-CM | POA: Diagnosis not present

## 2023-05-14 DIAGNOSIS — D500 Iron deficiency anemia secondary to blood loss (chronic): Secondary | ICD-10-CM | POA: Diagnosis not present

## 2023-05-14 LAB — TYPE AND SCREEN
ABO/RH(D): O POS
Antibody Screen: NEGATIVE
Unit division: 0

## 2023-05-14 LAB — CBC
HCT: 28.2 % — ABNORMAL LOW (ref 36.0–46.0)
HCT: 27.5 % — ABNORMAL LOW (ref 36.0–46.0)
Hemoglobin: 9.1 g/dL — ABNORMAL LOW (ref 12.0–15.0)
Hemoglobin: 9 g/dL — ABNORMAL LOW (ref 12.0–15.0)
MCH: 29.5 pg (ref 26.0–34.0)
MCH: 29.7 pg (ref 26.0–34.0)
MCHC: 32.3 g/dL (ref 30.0–36.0)
MCHC: 32.7 g/dL (ref 30.0–36.0)
MCV: 91.6 fL (ref 80.0–100.0)
MCV: 90.8 fL (ref 80.0–100.0)
Platelets: 401 10*3/uL — ABNORMAL HIGH (ref 150–400)
Platelets: 401 10*3/uL — ABNORMAL HIGH (ref 150–400)
RBC: 3.08 MIL/uL — ABNORMAL LOW (ref 3.87–5.11)
RBC: 3.03 MIL/uL — ABNORMAL LOW (ref 3.87–5.11)
RDW: 14.5 % (ref 11.5–15.5)
RDW: 14.4 % (ref 11.5–15.5)
WBC: 10.2 10*3/uL (ref 4.0–10.5)
WBC: 9.3 10*3/uL (ref 4.0–10.5)
nRBC: 0 % (ref 0.0–0.2)
nRBC: 0 % (ref 0.0–0.2)

## 2023-05-14 LAB — HEMOGLOBIN AND HEMATOCRIT, BLOOD
HCT: 29.8 % — ABNORMAL LOW (ref 36.0–46.0)
Hemoglobin: 9.5 g/dL — ABNORMAL LOW (ref 12.0–15.0)

## 2023-05-14 LAB — BPAM RBC
Blood Product Expiration Date: 202408082359
ISSUE DATE / TIME: 202407112139
Unit Type and Rh: 5100

## 2023-05-14 LAB — CREATININE, SERUM
Creatinine, Ser: 0.65 mg/dL (ref 0.44–1.00)
GFR, Estimated: >60 mL/min (ref >60)

## 2023-05-14 MED ORDER — NICOTINE 14 MG/24HR TD PT24
14.0000 mg | MEDICATED_PATCH | Freq: Every day | TRANSDERMAL | Status: DC
  Administered 2023-05-14: 14 mg via TRANSDERMAL
  Filled 2023-05-14: qty 1

## 2023-05-14 MED ORDER — TRAZODONE HCL 50 MG PO TABS
50.0000 mg | ORAL_TABLET | Freq: Once | ORAL | Status: AC
  Administered 2023-05-14: 50 mg via ORAL
  Filled 2023-05-14: qty 1

## 2023-05-14 NOTE — Consult Note (Addendum)
Reason for Consult: Symptomatic anemia recent diverticular bleeding Referring Physician: Hospital team  Tonya Delgado is an 67 y.o. female.  HPI: Patient seen and examined in her hospital computer chart and our office computer chart reviewed and her bleeding really stopped on Tuesday and her prep yesterday had old blood and then was clear and her hemoglobin came up nicely with transfusion and she has no GI complaints and this was her first diverticular bleeding and she is not on any aspirin nonsteroidals or blood thinners uses Tylenol for her headaches and unfortunately her mother died and she has to get to [place] fairly quickly we had a long talk about the options on how to proceed discussed her colonoscopy less than 2 years ago and we answered all of her questions reassurance was given about her CT during last hospital stay  Past Medical History:  Diagnosis Date   Anxiety    Depression    Hyperlipidemia    Hypertension    Pneumonia    Skin 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,bilateral shoulders, chest, and chin    Past Surgical History:  Procedure Laterality Date   ADJACENT TISSUE TRANSFER/TISSUE REARRANGEMENT N/A 01/13/2022   Procedure: Repair of chin with local tissue rearrangement;  Surgeon: Janne Napoleon, MD;  Location: [facility] OR;  Service: Plastics;  Laterality: N/A;  1 hr   APPENDECTOMY     CESAREAN SECTION     x 2   CHOLECYSTECTOMY     DG GALL BLADDER     LAPAROSCOPIC HYSTERECTOMY     SKIN FULL THICKNESS GRAFT N/A 12/05/2021   Procedure: local tissue rearrangment, mucosal closure and burring of mandible bone;  Surgeon: Janne Napoleon, MD;  Location: [facility] OR;  Service: Plastics;  Laterality: N/A;    Family History  Problem Relation Age of Onset   Hyperlipidemia Mother    Hypertension Mother    Lung cancer Father    Heart failure Father    Hypertension Sister    Lung cancer Sister    Hypertension Brother     Social History:  reports that she has been smoking cigarettes.  She has never used smokeless tobacco. She reports current alcohol use. She reports that she does not currently use drugs.  Allergies: No Known Allergies  Medications: I have reviewed the patient's current medications.  Results for orders placed or performed during the hospital encounter of 05/13/23 (from the past 48 hour(s))  Comprehensive metabolic panel     Status: Abnormal   Collection Time: 05/13/23  7:04 PM  Result Value Ref Range   Sodium 133 (L) 135 - 145 mmol/L   Potassium 3.8 3.5 - 5.1 mmol/L   Chloride 102 98 - 111 mmol/L   CO2 25 22 - 32 mmol/L   Glucose, Bld 95 70 - 99 mg/dL    Comment: Glucose reference range applies only to samples taken after fasting for at least 8 hours.   BUN 12 8 - 23 mg/dL   Creatinine, Ser 1.61 0.44 - 1.00 mg/dL   Calcium 8.1 (L) 8.9 - 10.3 mg/dL   Total Protein 6.0 (L) 6.5 - 8.1 g/dL   Albumin 3.2 (L) 3.5 - 5.0 g/dL   AST 19 15 - 41 U/L   ALT 16 0 - 44 U/L   Alkaline Phosphatase 37 (L) 38 - 126 U/L   Total Bilirubin 0.4 0.3 - 1.2 mg/dL   GFR, Estimated >09 >60 mL/min    Comment: (NOTE) Calculated using the CKD-EPI Creatinine Equation (  2021)    Anion gap 6 5 - 15    Comment: Performed at [facility], [address] [place], [place] [address]  CBC with Differential     Status: Abnormal   Collection Time: 05/13/23  7:04 PM  Result Value Ref Range   WBC 7.6 4.0 - 10.5 K/uL   RBC 2.37 (L) 3.87 - 5.11 MIL/uL   Hemoglobin 6.8 (LL) 12.0 - 15.0 g/dL    Comment: REPEATED TO VERIFY THIS CRITICAL RESULT HAS VERIFIED AND BEEN CALLED TO [facility] BY JENNIFER COLE ON 07 11 2024 AT 1942, AND HAS BEEN READ BACK.     HCT 21.7 (L) 36.0 - 46.0 %   MCV 91.6 80.0 - 100.0 fL   MCH 28.7 26.0 - 34.0 pg   MCHC 31.3 30.0 - 36.0 g/dL   RDW 60.4 54.0 - 98.1 %   Platelets 379 150 - 400 K/uL   nRBC 0.0 0.0 - 0.2 %   Neutrophils Relative % 60 %   Neutro Abs 4.6 1.7 - 7.7 K/uL   Lymphocytes Relative 28 %   Lymphs Abs 2.2 0.7 - 4.0  K/uL   Monocytes Relative 8 %   Monocytes Absolute 0.6 0.1 - 1.0 K/uL   Eosinophils Relative 2 %   Eosinophils Absolute 0.1 0.0 - 0.5 K/uL   Basophils Relative 1 %   Basophils Absolute 0.0 0.0 - 0.1 K/uL   Immature Granulocytes 1 %   Abs Immature Granulocytes 0.04 0.00 - 0.07 K/uL    Comment: Performed at [facility], [address], [place], [place] [address]  Type and screen [facility]     Status: None (Preliminary result)   Collection Time: 05/13/23  7:04 PM  Result Value Ref Range   ABO/RH(D) O POS    Antibody Screen NEG    Sample Expiration 05/16/2023,2359    Unit Number [id number]    Blood Component Type RED CELLS,LR    Unit division 00    Status of Unit ISSUED    Transfusion Status OK TO TRANSFUSE    Crossmatch Result      Compatible Performed at [facility], [address] [place], [place] [address]   POC occult blood, ED     Status: Abnormal   Collection Time: 05/13/23  7:57 PM  Result Value Ref Range   Fecal Occult Bld POSITIVE (A) NEGATIVE  Prepare RBC (crossmatch)     Status: None   Collection Time: 05/13/23  8:08 PM  Result Value Ref Range   Order Confirmation      ORDER PROCESSED BY BLOOD BANK Performed at [facility], [address] [place], [place] [address]   CBC     Status: Abnormal   Collection Time: 05/14/23  5:16 AM  Result Value Ref Range   WBC 10.2 4.0 - 10.5 K/uL   RBC 3.08 (L) 3.87 - 5.11 MIL/uL   Hemoglobin 9.1 (L) 12.0 - 15.0 g/dL    Comment: REPEATED TO VERIFY POST TRANSFUSION SPECIMEN    HCT 28.2 (L) 36.0 - 46.0 %   MCV 91.6 80.0 - 100.0 fL   MCH 29.5 26.0 - 34.0 pg   MCHC 32.3 30.0 - 36.0 g/dL   RDW 41.3 24.4 - 01.0 %   Platelets 401 (H) 150 - 400 K/uL   nRBC 0.0 0.0 - 0.2 %    Comment: Performed at [facility], [address] [place], [place]  [address]  Creatinine, serum     Status: None   Collection Time: 05/14/23  5:16 AM   Result Value Ref Range   Creatinine, Ser 0.65 0.44 - 1.00 mg/dL   GFR, Estimated >60 >45 mL/min    Comment: (NOTE) Calculated using the CKD-EPI Creatinine Equation (2021) Performed at [facility], [address], [place], [place] [address]   CBC     Status: Abnormal   Collection Time: 05/14/23  6:54 AM  Result Value Ref Range   WBC 9.3 4.0 - 10.5 K/uL   RBC 3.03 (L) 3.87 - 5.11 MIL/uL   Hemoglobin 9.0 (L) 12.0 - 15.0 g/dL   HCT 19.1 (L) 47.8 - 29.5 %   MCV 90.8 80.0 - 100.0 fL   MCH 29.7 26.0 - 34.0 pg   MCHC 32.7 30.0 - 36.0 g/dL   RDW 62.1 30.8 - 65.7 %   Platelets 401 (H) 150 - 400 K/uL   nRBC 0.0 0.0 - 0.2 %    Comment: Performed at [facility], [address], [place], [place] [address]    No results found.  ROS negative except above Blood pressure 116/70, pulse 67, temperature 98.2 F (36.8 C), temperature source Oral, resp. rate 18, height 5\' 5"  (1.651 m), weight 53.9 kg, SpO2 96%. Physical Exam vital signs stable afebrile no acute distress abdomen is soft nontender chemistry is okay Creatinine normal hemoglobin actually dropped to 6.8 on Wednesday at her primary care doctor's office hemoglobin came up nicely with transfusion  Assessment/Plan: Symptomatic anemia it sounds like all her bleeding stopped on Tuesday just had some equilibration Plan: Keep on clear liquids today just in case she restarts an urgent colonoscopy as needed and asked her to ambulate the halls and see how she feels repeat CBC in a.m. and if okay and stable probably go home and be okay to travel to [place] and she should avoid aspirin and nonsteroidals while there and follow-up with my partner Dr. Marca Ancona when she gets back from [place] and I will check on tomorrow with discharge before rounds and she agrees with the plan and although I do not think she needs it or qualifies for it consider IV iron or erythropoietin just to build her up more and possibly make her  travel plans Better  [facility] E 05/14/2023, 11:49 AM

## 2023-05-14 NOTE — Plan of Care (Signed)
Patient asleep.  Bowel prep well tolerated this shift, BM x2 reported, black. VS stable and within baseline. Headache resolve with tylenol. Continue treatment regimen and provide safety.   Problem: Education: Goal: Knowledge of General Education information will improve Description: Including pain rating scale, medication(s)/side effects and non-pharmacologic comfort measures Outcome: Progressing   Problem: Health Behavior/Discharge Planning: Goal: Ability to manage health-related needs will improve Outcome: Progressing   Problem: Activity: Goal: Risk for activity intolerance will decrease Outcome: Progressing   Problem: Nutrition: Goal: Adequate nutrition will be maintained Outcome: Progressing   Problem: Elimination: Goal: Will not experience complications related to bowel motility Outcome: Progressing   Problem: Pain Managment: Goal: General experience of comfort will improve Outcome: Progressing   Problem: Safety: Goal: Ability to remain free from injury will improve Outcome: Progressing

## 2023-05-14 NOTE — Plan of Care (Signed)

## 2023-05-14 NOTE — Progress Notes (Signed)
PROGRESS NOTE    Tonya Delgado  JXB:[id number] DOB: 02/14/1956 DOA: 05/13/2023 PCP: Jarrett Soho, PA-C   Brief Narrative:  67 y.o. female with medical history significant of hypertension, hyperlipidemia, depression, recent admission from 05/08/2023-05/09/2023 for possible diverticular bleed requiring 1 unit packed red cells transfusion and subsequent discharge presented with recurrence of rectal bleeding.  On presentation, hemoglobin was 6.8.  She was transfused 1 unit packed red cells.  GI was consulted.  Assessment & Plan:   Rectal bleeding/possible lower GI bleeding from diverticular bleeding Acute blood loss anemia -Had recent hospitalization with similar presentation requiring 1 unit packed red cell transfusion -Hemoglobin 6.8 on presentation.  Status post 1 unit packed red cell transfusion.  Hemoglobin 9 this morning.  Awaiting GI recommendations.  Monitor H&H.  Restless leg syndrome -Continue gabapentin and Requip  Hypertension Hyperlipidemia -Continue losartan and atorvastatin   Depression -Continue sertraline and BuSpar    DVT prophylaxis: SCDs Code Status: Full Family Communication: None at bedside Disposition Plan: Status is: Observation The patient will require care spanning > 2 midnights and should be moved to inpatient because: Of severity of illness.  Need for GI evaluation    Consultants: GI  Procedures: None  Antimicrobials: None   Subjective: Patient seen and examined at bedside.  Denies worsening abdominal pain, nausea, vomiting or fever.  Objective: Vitals:   05/13/23 2353 05/14/23 0126 05/14/23 0554 05/14/23 0913  BP:  (!) 144/81 104/63 116/70  Pulse:  66 73 67  Resp:  16 15 18   Temp:  (!) 97.4 F (36.3 C) 97.9 F (36.6 C) 98.2 F (36.8 C)  TempSrc:  Oral Oral Oral  SpO2:  100% 98% 96%  Weight: 53.9 kg     Height:        Intake/Output Summary (Last 24 hours) at 05/14/2023 1123 Last data filed at 05/14/2023 0355 Gross per 24 hour   Intake 2284.34 ml  Output --  Net 2284.34 ml   Filed Weights   05/13/23 1724 05/13/23 2353  Weight: 55 kg 53.9 kg    Examination:  General exam: Appears calm and comfortable.  On room air. Respiratory system: Bilateral decreased breath sounds at bases Cardiovascular system: S1 & S2 heard, Rate controlled Gastrointestinal system: Abdomen is nondistended, soft and nontender. Normal bowel sounds heard. Extremities: No cyanosis, clubbing, edema    Data Reviewed: I have personally reviewed following labs and imaging studies  CBC: Recent Labs  Lab 05/08/23 1247 05/08/23 1448 05/09/23 0550 05/09/23 0637 05/09/23 0911 05/13/23 1904 05/14/23 0516 05/14/23 0654  WBC 6.8  --  6.7  --   --  7.6 10.2 9.3  NEUTROABS  --   --   --   --   --  4.6  --   --   HGB 8.3*   < > 10.0* 10.0* 9.7* 6.8* 9.1* 9.0*  HCT 25.8*   < > 30.8* 30.8* 29.9* 21.7* 28.2* 27.5*  MCV 89.6  --  89.3  --   --  91.6 91.6 90.8  PLT 310  --  275  --   --  379 401* 401*   < > = values in this interval not displayed.   Basic Metabolic Panel: Recent Labs  Lab 05/08/23 1247 05/09/23 0550 05/13/23 1904 05/14/23 0516  NA 134* 137 133*  --   K 3.3* 3.3* 3.8  --   CL 106 106 102  --   CO2 21* 25 25  --   GLUCOSE 108* 93 95  --  BUN 15 8 12   --   CREATININE 0.62 0.64 0.69 0.65  CALCIUM 7.4* 8.0* 8.1*  --    GFR: Estimated Creatinine Clearance: 59.7 mL/min (by C-G formula based on SCr of 0.65 mg/dL). Liver Function Tests: Recent Labs  Lab 05/08/23 1247 05/13/23 1904  AST 14* 19  ALT 27 16  ALKPHOS 41 37*  BILITOT 0.2* 0.4  PROT 5.4* 6.0*  ALBUMIN 2.9* 3.2*   No results for input(s): "LIPASE", "AMYLASE" in the last 168 hours. No results for input(s): "AMMONIA" in the last 168 hours. Coagulation Profile: Recent Labs  Lab 05/08/23 1314  INR 1.1   Cardiac Enzymes: No results for input(s): "CKTOTAL", "CKMB", "CKMBINDEX", "TROPONINI" in the last 168 hours. BNP (last 3 results) No results for  input(s): "PROBNP" in the last 8760 hours. HbA1C: No results for input(s): "HGBA1C" in the last 72 hours. CBG: No results for input(s): "GLUCAP" in the last 168 hours. Lipid Profile: No results for input(s): "CHOL", "HDL", "LDLCALC", "TRIG", "CHOLHDL", "LDLDIRECT" in the last 72 hours. Thyroid Function Tests: No results for input(s): "TSH", "T4TOTAL", "FREET4", "T3FREE", "THYROIDAB" in the last 72 hours. Anemia Panel: No results for input(s): "VITAMINB12", "FOLATE", "FERRITIN", "TIBC", "IRON", "RETICCTPCT" in the last 72 hours. Sepsis Labs: No results for input(s): "PROCALCITON", "LATICACIDVEN" in the last 168 hours.  No results found for this or any previous visit (from the past 240 hour(s)).       Radiology Studies: No results found.      Scheduled Meds:  atorvastatin  10 mg Oral QPM   busPIRone  10 mg Oral QHS   gabapentin  300 mg Oral QHS   losartan  12.5 mg Oral QPM   rOPINIRole  1 mg Oral QHS   sertraline  50 mg Oral QPM   Continuous Infusions:        Glade Lloyd, MD [facility] 05/14/2023, 11:23 AM

## 2023-05-14 NOTE — Progress Notes (Signed)
Blod transfusion complete, well tolerated. VS stable. No reports of transfusion reaction to blood product.

## 2023-05-15 DIAGNOSIS — D500 Iron deficiency anemia secondary to blood loss (chronic): Secondary | ICD-10-CM | POA: Diagnosis not present

## 2023-05-15 DIAGNOSIS — K922 Gastrointestinal hemorrhage, unspecified: Principal | ICD-10-CM

## 2023-05-15 DIAGNOSIS — K921 Melena: Secondary | ICD-10-CM | POA: Diagnosis not present

## 2023-05-15 LAB — BASIC METABOLIC PANEL
Anion gap: 8 (ref 5–15)
BUN: 6 mg/dL — ABNORMAL LOW (ref 8–23)
CO2: 25 mmol/L (ref 22–32)
Calcium: 8.6 mg/dL — ABNORMAL LOW (ref 8.9–10.3)
Chloride: 104 mmol/L (ref 98–111)
Creatinine, Ser: 0.67 mg/dL (ref 0.44–1.00)
GFR, Estimated: >60 mL/min (ref >60)
Glucose, Bld: 94 mg/dL (ref 70–99)
Potassium: 4.6 mmol/L (ref 3.5–5.1)
Sodium: 137 mmol/L (ref 135–145)

## 2023-05-15 LAB — CBC WITH DIFFERENTIAL/PLATELET
Abs Immature Granulocytes: 0.04 10*3/uL (ref 0.00–0.07)
Basophils Absolute: 0 10*3/uL (ref 0.0–0.1)
Basophils Relative: 0 %
Eosinophils Absolute: 0.2 10*3/uL (ref 0.0–0.5)
Eosinophils Relative: 3 %
HCT: 29 % — ABNORMAL LOW (ref 36.0–46.0)
Hemoglobin: 9.1 g/dL — ABNORMAL LOW (ref 12.0–15.0)
Immature Granulocytes: 1 %
Lymphocytes Relative: 35 %
Lymphs Abs: 2.1 10*3/uL (ref 0.7–4.0)
MCH: 29.3 pg (ref 26.0–34.0)
MCHC: 31.4 g/dL (ref 30.0–36.0)
MCV: 93.2 fL (ref 80.0–100.0)
Monocytes Absolute: 0.6 10*3/uL (ref 0.1–1.0)
Monocytes Relative: 11 %
Neutro Abs: 3.1 10*3/uL (ref 1.7–7.7)
Neutrophils Relative %: 50 %
Platelets: 446 10*3/uL — ABNORMAL HIGH (ref 150–400)
RBC: 3.11 MIL/uL — ABNORMAL LOW (ref 3.87–5.11)
RDW: 14.7 % (ref 11.5–15.5)
WBC: 6.1 10*3/uL (ref 4.0–10.5)
nRBC: 0 % (ref 0.0–0.2)

## 2023-05-15 LAB — MAGNESIUM: Magnesium: 2.4 mg/dL (ref 1.7–2.4)

## 2023-05-15 NOTE — Plan of Care (Signed)

## 2023-05-15 NOTE — Progress Notes (Signed)
Pt is discharging home with no needs. Pt is alert and oriented. AVS was given and explained. Pt was given time to ask questions. IV was discontinued. Belongings were packed and sent home with the pt. Pt husband at bedside to transport pt home. Pt ambulated to main entrance for discharge.

## 2023-05-15 NOTE — Discharge Summary (Signed)
Physician Discharge Summary  Tonya Delgado ZOX:[id number] DOB: January 20, 1956 DOA: 05/13/2023  PCP: Jarrett Soho, PA-C  Admit date: 05/13/2023 Discharge date: 05/15/2023  Admitted From: Home Disposition: Home  Recommendations for Outpatient Follow-up:  Follow up with PCP in 1 week with repeat CBC/BMP Outpatient follow-up with GI Follow up in ED if symptoms worsen or new appear   Home Health: No Equipment/Devices: None  Discharge Condition: Stable CODE STATUS: Full Diet recommendation: Heart healthy  Brief/Interim Summary: 67 y.o. female with medical history significant of hypertension, hyperlipidemia, depression, recent admission from 05/08/2023-05/09/2023 for possible diverticular bleed requiring 1 unit packed red cells transfusion and subsequent discharge presented with recurrence of rectal bleeding.  On presentation, hemoglobin was 6.8.  She was transfused 1 unit packed red cells.  GI was consulted.  She was managed conservatively.  During the hospitalization, her condition has improved and her bleeding has stopped.  Hemoglobin is 9.1 this morning.  GI has cleared the patient for discharge.  Discharge patient home today with outpatient follow-up with PCP and GI.    Discharge Diagnoses:   Rectal bleeding/possible lower GI bleeding from diverticular bleeding Acute blood loss anemia -Had recent hospitalization with similar presentation requiring 1 unit packed red cell transfusion - GI was consulted.  She was managed conservatively.  During the hospitalization, her condition has improved and her bleeding has stopped.  -GI has cleared the patient for discharge.  Discharge patient home today with outpatient follow-up with PCP and GI.  Outpatient follow-up of BMP   Restless leg syndrome -Continue gabapentin.  Outpatient follow-up with PCP  Hypertension Hyperlipidemia -Continue losartan and atorvastatin     Depression -Continue sertraline and BuSpar   Discharge  Instructions   Allergies as of 05/15/2023   No Known Allergies      Medication List     STOP taking these medications    traMADol 50 MG tablet Commonly known as: ULTRAM       TAKE these medications    acetaminophen 500 MG tablet Commonly known as: TYLENOL Take 500 mg by mouth as needed (pain.).   atorvastatin 10 MG tablet Commonly known as: LIPITOR Take 10 mg by mouth every evening.   bismuth subsalicylate 262 MG/15ML suspension Commonly known as: PEPTO BISMOL Take 30 mLs by mouth as needed for indigestion.   busPIRone 10 MG tablet Commonly known as: BUSPAR Take 10 mg by mouth at bedtime.   CALCIUM 600+D3 PO Take 1 tablet by mouth daily.   COLLAGEN-VITAMIN C PO Take 2 tablets by mouth daily.   estrogens (conjugated) 0.625 MG tablet Commonly known as: PREMARIN Take 0.3125 mg by mouth every evening.   ferrous sulfate 325 (65 FE) MG EC tablet Take 325 mg by mouth every other day.   Fish Oil 1000 MG Caps Take 1,000 mg by mouth daily.   gabapentin 300 MG capsule Commonly known as: NEURONTIN Take 300 mg by mouth at bedtime as needed (restless leg syndrome per patient).   losartan 25 MG tablet Commonly known as: COZAAR Take 12.5 mg by mouth every evening.   Magnesium 250 MG Tabs Take 250 mg by mouth daily.   sertraline 50 MG tablet Commonly known as: ZOLOFT Take 75 mg by mouth every evening.   SUMAtriptan 100 MG tablet Commonly known as: IMITREX Take 100 mg by mouth every 2 (two) hours as needed for migraine (max 2 doses/24hrs.). May repeat in 2 hours if headache persists or recurs.   triamcinolone 55 MCG/ACT Aero nasal inhaler Commonly known as: NASACORT  Place 1 spray into the nose daily.   VISINE OP Place 1 drop into both eyes as needed (redness).   Vitamin B-12 2500 MCG Subl Take 2,500 mcg by mouth daily.        Follow-up Information     Jarrett Soho, PA-C Follow up in 1 week(s).   Specialty: Family Medicine Why: with repeat  cbc Contact information: [address] [place] [address] [phone]                No Known Allergies  Consultations: GI   Procedures/Studies: CT Angio Abd/Pel W and/or Wo Contrast  Result Date: 05/08/2023 CLINICAL DATA:  Prior red blood with bowel movements since early this morning. EXAM: CTA ABDOMEN AND PELVIS WITHOUT AND WITH CONTRAST TECHNIQUE: Multidetector CT imaging of the abdomen and pelvis was performed using the standard protocol during bolus administration of intravenous contrast. Multiplanar reconstructed images and MIPs were obtained and reviewed to evaluate the vascular anatomy. RADIATION DOSE REDUCTION: This exam was performed according to the departmental dose-optimization program which includes automated exposure control, adjustment of the mA and/or kV according to patient size and/or use of iterative reconstruction technique. CONTRAST:  OMNIPAQUE IOHEXOL 350 MG/ML SOLN COMPARISON:  04/26/2021. FINDINGS: VASCULAR Aorta: Aorta normal in caliber. Moderate atherosclerosis. No dissection. No significant stenosis. Celiac: Small celiac axis without significant stenosis. SMA: Atherosclerosis along the proximal superior mesenteric artery without significant stenosis. Renals: Single right and dual left renal arteries, widely patent. IMA: Patent IMA without significant stenosis. Inflow: Atherosclerosis of both common iliac arteries, greater on the left. No hemodynamically significant stenosis. 60-70% narrowing at the origin of the left internal iliac artery. External iliac arteries widely patent. Proximal Outflow: Bilateral common femoral and visualized portions of the superficial and profunda femoral arteries are patent without evidence of aneurysm, dissection, vasculitis or significant stenosis. Veins: Unremarkable. Review of the MIP images confirms the above findings. NON-VASCULAR Lower chest: No acute abnormality. Hepatobiliary: No focal liver abnormality is seen.  Status post cholecystectomy. No biliary dilatation. Pancreas: Unremarkable. No pancreatic ductal dilatation or surrounding inflammatory changes. Spleen: Normal in size without focal abnormality. Adrenals/Urinary Tract: Small stable left adrenal nodule consistent with an adenoma. No follow-up recommended. Normal right adrenal gland. Normal kidneys, ureters and bladder. Stomach/Bowel: There is no contrast extravasation to indicate a GI bleeding source. Multiple colonic diverticula, most numerous along the sigmoid. No diverticulitis. Small bowel and colon are normal in caliber. No wall thickening or inflammation. Normal stomach. Lymphatic: No enlarged lymph nodes. Reproductive: Status post hysterectomy. No adnexal masses. Other: No abdominal wall hernia or abnormality. No abdominopelvic ascites. Musculoskeletal: No fracture or acute finding.  No bone lesion. IMPRESSION: VASCULAR 1. No evidence of a GI bleeding source. 2. Aortic atherosclerosis without significant stenosis. 3. Small celiac axis, which appears developmental and similar to the prior CT. 4. No hemodynamically significant narrowing of the aortic branch vessels. NON-VASCULAR 1. Multiple colonic diverticula, most evident along the sigmoid. Although no contrast extravasation was noted on the exam, diverticula may be the source of GI bleeding. No evidence of diverticulitis. 2. No acute findings in the abdomen or pelvis. Electronically Signed   By: Amie Portland M.D.   On: 05/08/2023 14:32      Subjective: Patient seen and examined at bedside.  Denies any more rectal bleeding no fever, vomiting, chest pain reported.  Discharge Exam: Vitals:   05/14/23 1957 05/15/23 0539  BP: (!) 142/73 127/79  Pulse: 70 74  Resp: 16 18  Temp: 97.9 F (  36.6 C) 97.8 F (36.6 C)  SpO2: 100% 100%    General: Pt is alert, awake, not in acute distress.  On room air. Cardiovascular: rate controlled, S1/S2 + Respiratory: bilateral decreased breath sounds at  bases Abdominal: Soft, NT, ND, bowel sounds + Extremities: no edema, no cyanosis    The results of significant diagnostics from this hospitalization (including imaging, microbiology, ancillary and laboratory) are listed below for reference.     Microbiology: No results found for this or any previous visit (from the past 240 hour(s)).   Labs: BNP (last 3 results) No results for input(s): "BNP" in the last 8760 hours. Basic Metabolic Panel: Recent Labs  Lab 05/08/23 1247 05/09/23 0550 05/13/23 1904 05/14/23 0516 05/15/23 0414  NA 134* 137 133*  --  137  K 3.3* 3.3* 3.8  --  4.6  CL 106 106 102  --  104  CO2 21* 25 25  --  25  GLUCOSE 108* 93 95  --  94  BUN 15 8 12   --  6*  CREATININE 0.62 0.64 0.69 0.65 0.67  CALCIUM 7.4* 8.0* 8.1*  --  8.6*  MG  --   --   --   --  2.4   Liver Function Tests: Recent Labs  Lab 05/08/23 1247 05/13/23 1904  AST 14* 19  ALT 27 16  ALKPHOS 41 37*  BILITOT 0.2* 0.4  PROT 5.4* 6.0*  ALBUMIN 2.9* 3.2*   No results for input(s): "LIPASE", "AMYLASE" in the last 168 hours. No results for input(s): "AMMONIA" in the last 168 hours. CBC: Recent Labs  Lab 05/09/23 0550 05/09/23 0637 05/13/23 1904 05/14/23 0516 05/14/23 0654 05/14/23 1841 05/15/23 0414  WBC 6.7  --  7.6 10.2 9.3  --  6.1  NEUTROABS  --   --  4.6  --   --   --  3.1  HGB 10.0*   < > 6.8* 9.1* 9.0* 9.5* 9.1*  HCT 30.8*   < > 21.7* 28.2* 27.5* 29.8* 29.0*  MCV 89.3  --  91.6 91.6 90.8  --  93.2  PLT 275  --  379 401* 401*  --  446*   < > = values in this interval not displayed.   Cardiac Enzymes: No results for input(s): "CKTOTAL", "CKMB", "CKMBINDEX", "TROPONINI" in the last 168 hours. BNP: Invalid input(s): "POCBNP" CBG: No results for input(s): "GLUCAP" in the last 168 hours. D-Dimer No results for input(s): "DDIMER" in the last 72 hours. Hgb A1c No results for input(s): "HGBA1C" in the last 72 hours. Lipid Profile No results for input(s): "CHOL", "HDL",  "LDLCALC", "TRIG", "CHOLHDL", "LDLDIRECT" in the last 72 hours. Thyroid function studies No results for input(s): "TSH", "T4TOTAL", "T3FREE", "THYROIDAB" in the last 72 hours.  Invalid input(s): "FREET3" Anemia work up No results for input(s): "VITAMINB12", "FOLATE", "FERRITIN", "TIBC", "IRON", "RETICCTPCT" in the last 72 hours. Urinalysis No results found for: "COLORURINE", "APPEARANCEUR", "LABSPEC", "PHURINE", "GLUCOSEU", "HGBUR", "BILIRUBINUR", "KETONESUR", "PROTEINUR", "UROBILINOGEN", "NITRITE", "LEUKOCYTESUR" Sepsis Labs Recent Labs  Lab 05/13/23 1904 05/14/23 0516 05/14/23 0654 05/15/23 0414  WBC 7.6 10.2 9.3 6.1   Microbiology No results found for this or any previous visit (from the past 240 hour(s)).   Time coordinating discharge: 35 minutes  SIGNED:   Glade Lloyd, MD  [facility] 05/15/2023, 11:11 AM

## 2023-05-15 NOTE — Progress Notes (Signed)
Patient seen and case discussed with the husband and the hospital team and all of her questions were answered and we discussed diet and follow-up and iron and warning signs and reassurance was given about no bowel movements and stable hemoglobin and agree with discharge and I wished them well on their trip and apologies about her mother's passing

## 2023-05-31 DIAGNOSIS — Z13220 Encounter for screening for lipoid disorders: Secondary | ICD-10-CM | POA: Diagnosis not present

## 2023-05-31 DIAGNOSIS — D649 Anemia, unspecified: Secondary | ICD-10-CM | POA: Diagnosis not present

## 2023-05-31 DIAGNOSIS — Z136 Encounter for screening for cardiovascular disorders: Secondary | ICD-10-CM | POA: Diagnosis not present

## 2023-06-15 NOTE — Progress Notes (Signed)
Tonya Delgado presents today for follow-up after completing radiation to her chin on 05/20/2022   Pain issues, if any: Denies pain.  Weight changes, if any: Denies changes. Wt Readings from Last 3 Encounters:  06/23/23 118 lb 2 oz (53.6 kg)  05/13/23 118 lb 13.3 oz (53.9 kg)  05/08/23 121 lb 4.1 oz (55 kg)     Swallowing issues, if any:   Smoking or chewing tobacco? Current smoker. Using fluoride toothpaste daily? N/a Last Dermatology visit was on: Last seen in June 2024. Other notable issues, if any:

## 2023-06-18 ENCOUNTER — Other Ambulatory Visit (HOSPITAL_COMMUNITY): Payer: Self-pay | Admitting: Family Medicine

## 2023-06-18 DIAGNOSIS — I700 Atherosclerosis of aorta: Secondary | ICD-10-CM | POA: Diagnosis not present

## 2023-06-18 DIAGNOSIS — J439 Emphysema, unspecified: Secondary | ICD-10-CM | POA: Diagnosis not present

## 2023-06-18 DIAGNOSIS — F17200 Nicotine dependence, unspecified, uncomplicated: Secondary | ICD-10-CM | POA: Diagnosis not present

## 2023-06-18 DIAGNOSIS — F419 Anxiety disorder, unspecified: Secondary | ICD-10-CM | POA: Diagnosis not present

## 2023-06-18 DIAGNOSIS — D508 Other iron deficiency anemias: Secondary | ICD-10-CM | POA: Diagnosis not present

## 2023-06-18 DIAGNOSIS — Z0000 Encounter for general adult medical examination without abnormal findings: Secondary | ICD-10-CM | POA: Diagnosis not present

## 2023-06-18 DIAGNOSIS — I10 Essential (primary) hypertension: Secondary | ICD-10-CM | POA: Diagnosis not present

## 2023-06-18 DIAGNOSIS — I7090 Unspecified atherosclerosis: Secondary | ICD-10-CM | POA: Diagnosis not present

## 2023-06-18 DIAGNOSIS — Z85828 Personal history of other malignant neoplasm of skin: Secondary | ICD-10-CM | POA: Diagnosis not present

## 2023-06-23 ENCOUNTER — Ambulatory Visit
Admission: RE | Admit: 2023-06-23 | Discharge: 2023-06-23 | Disposition: A | Discharge status: Home / Self Care | Payer: Medicare PPO | Source: Ambulatory Visit | Attending: Radiation Oncology | Admitting: Radiation Oncology

## 2023-06-23 ENCOUNTER — Other Ambulatory Visit: Payer: Self-pay

## 2023-06-23 ENCOUNTER — Encounter: Payer: Self-pay | Admitting: Radiation Oncology

## 2023-06-23 VITALS — BP 137/77 | HR 88 | Temp 96.4°F | Resp 18 | Ht 65.0 in | Wt 118.1 lb

## 2023-06-23 DIAGNOSIS — C44319 Basal cell carcinoma of skin of other parts of face: Secondary | ICD-10-CM

## 2023-06-24 ENCOUNTER — Encounter: Payer: Self-pay | Admitting: Radiation Oncology

## 2023-06-24 DIAGNOSIS — D509 Iron deficiency anemia, unspecified: Secondary | ICD-10-CM | POA: Diagnosis not present

## 2023-06-24 NOTE — Progress Notes (Signed)
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 [name] presents today for follow-up after completing radiation to her chin on 05/20/2022   Pain issues, if any: Denies pain.  Weight changes, if any: Denies changes. Wt Readings from Last 3 Encounters:  06/23/23 118 lb 2 oz (53.6 kg)  05/13/23 118 lb 13.3 oz (53.9 kg)  05/08/23 121 lb 4.1 oz (55 kg)      Smoking or chewing tobacco? Current smoker. Using fluoride toothpaste daily? N/a Last Dermatology visit was on: Last seen in June 2024. Other notable issues, if any: denies any new sores in mouth or nodules on chin Follows with her dermatologist and dentist closely.    BP 137/77 (BP Location: Left Arm, Patient Position: Sitting)   Pulse 88   Temp (!) 96.4 F (35.8 C) (Temporal)   Resp 18   Ht 5\' 5"  (1.651 m)   Wt 118 lb 2 oz (53.6 kg)   SpO2 98%   BMI 19.66 kg/m                                ALLERGIES:  has No Known Allergies.  Meds: Current Outpatient Medications  Medication Sig Dispense Refill   atorvastatin (LIPITOR) 10 MG tablet Take 10 mg by mouth every evening.     bismuth subsalicylate (PEPTO  BISMOL) 262 MG/15ML suspension Take 30 mLs by mouth as needed for indigestion.     busPIRone (BUSPAR) 10 MG tablet Take 10 mg by mouth at bedtime.     Calcium Carb-Cholecalciferol (CALCIUM 600+D3 PO) Take 1 tablet by mouth daily.     COLLAGEN-VITAMIN C PO Take 2 tablets by mouth daily.     Cyanocobalamin (VITAMIN B-12) 2500 MCG SUBL Take 2,500 mcg by mouth daily.     estrogens, conjugated, (PREMARIN) 0.625 MG tablet Take 0.3125 mg by mouth every evening.     gabapentin (NEURONTIN) 300 MG capsule Take 300 mg by mouth at bedtime as needed (restless leg syndrome per patient).     losartan (COZAAR) 25 MG tablet Take 12.5 mg by mouth every evening.     Magnesium 250 MG TABS Take 250 mg by mouth daily.     Naphazoline-Pheniramine (VISINE OP) Place 1 drop into both eyes as needed (redness).     Omega-3 Fatty Acids (FISH OIL) 1000 MG CAPS Take 1,000 mg by mouth daily.     sertraline (ZOLOFT) 50 MG tablet Take 75 mg by mouth every evening.     SUMAtriptan (IMITREX) 100 MG tablet Take 100 mg by mouth every 2 (two) hours as needed for migraine (  max 2 doses/24hrs.). May repeat in 2 hours if headache persists or recurs.     triamcinolone (NASACORT) 55 MCG/ACT AERO nasal inhaler Place 1 spray into the nose daily.     acetaminophen (TYLENOL) 500 MG tablet Take 500 mg by mouth as needed (pain.).     ferrous sulfate 325 (65 FE) MG EC tablet Take 325 mg by mouth every other day. (Patient not taking: Reported on 06/23/2023)     No current facility-administered medications for this encounter.    Physical Findings: The patient is in no acute distress. Patient is alert and oriented.  height is 5\' 5"  (1.651 m) and weight is 118 lb 2 oz (53.6 kg). Her temporal temperature is 96.4 F (35.8 C) (abnormal). Her blood pressure is 137/77 and her pulse is 88. Her respiration is 18 and oxygen saturation is 98%. .     General: Alert and oriented, in no acute distress HEENT: Head is normocephalic. Extraocular movements are  intact. Mild lymphedema in the submental region. Scar over chin has healed nicely with no sign of disease recurrence. Mucosa in the anterior mouth / gum appears healthy. No sign of recurrence in the inner lip or gum or floor of mouth. The rest of her neck nodes and periauricular nodes are all clear to palpation.   Neck: Neck is supple, no palpable cervical or supraclavicular lymphadenopathy. Musculoskeletal: symmetric strength and muscle tone throughout. Neurologic: Cranial nerves II through XII are grossly intact. No obvious focalities. Speech is fluent. Coordination is intact. Psychiatric: Judgment and insight are intact. Affect is appropriate.    Lab Findings: Lab Results  Component Value Date   WBC 6.1 05/15/2023   HGB 9.1 (L) 05/15/2023   HCT 29.0 (L) 05/15/2023   MCV 93.2 05/15/2023   PLT 446 (H) 05/15/2023    Radiographic Findings: No results found.  Impression/Plan: Patient is recovering well from radiation therapy. There is no evidence of disease recurrence on clinical exam today.   Physical therapy referral for her lymphedema has been helpful.   She sees her dentist every 4 months. She is using "Restore" rinses recommended by her dentist. We recommend talking to her dentist about using fluoride rinses or PREVIDENT 5000.    She continues to see her dermatologist regularly.   I will arrange for our H+N survivorship team to see the patient back in 6 months for surveillance.  Will need thorough head and neck exam including chin, neck nodes, and anterior mouth especially.   She will be referred back to Dr. Merry Proud and she was lost to follow-up with him  On date of service, in total, I spent 25 minutes on this encounter. Patient was seen in person.  Note signed after encounter date; minutes pertain to date of service, only.  _____________________________________   Joyice Faster, PA    Lonie Peak, MD

## 2023-06-30 ENCOUNTER — Telehealth: Payer: Self-pay

## 2023-07-06 ENCOUNTER — Encounter: Payer: Self-pay | Admitting: *Deleted

## 2023-07-12 ENCOUNTER — Ambulatory Visit (HOSPITAL_COMMUNITY)
Admission: RE | Admit: 2023-07-12 | Discharge: 2023-07-12 | Disposition: A | Discharge status: Home / Self Care | Payer: Medicare PPO | Source: Ambulatory Visit | Attending: Family Medicine | Admitting: Family Medicine

## 2023-07-12 DIAGNOSIS — J439 Emphysema, unspecified: Secondary | ICD-10-CM | POA: Insufficient documentation

## 2023-07-12 DIAGNOSIS — Z87891 Personal history of nicotine dependence: Secondary | ICD-10-CM | POA: Diagnosis not present

## 2023-07-12 DIAGNOSIS — Z122 Encounter for screening for malignant neoplasm of respiratory organs: Secondary | ICD-10-CM | POA: Diagnosis not present

## 2023-07-12 DIAGNOSIS — F17210 Nicotine dependence, cigarettes, uncomplicated: Secondary | ICD-10-CM | POA: Diagnosis not present

## 2023-08-24 DIAGNOSIS — D508 Other iron deficiency anemias: Secondary | ICD-10-CM | POA: Diagnosis not present

## 2023-10-06 DIAGNOSIS — L821 Other seborrheic keratosis: Secondary | ICD-10-CM | POA: Diagnosis not present

## 2023-10-06 DIAGNOSIS — Z85828 Personal history of other malignant neoplasm of skin: Secondary | ICD-10-CM | POA: Diagnosis not present

## 2023-10-06 DIAGNOSIS — Z08 Encounter for follow-up examination after completed treatment for malignant neoplasm: Secondary | ICD-10-CM | POA: Diagnosis not present

## 2023-10-06 DIAGNOSIS — C44319 Basal cell carcinoma of skin of other parts of face: Secondary | ICD-10-CM | POA: Diagnosis not present

## 2023-10-06 DIAGNOSIS — L400 Psoriasis vulgaris: Secondary | ICD-10-CM | POA: Diagnosis not present

## 2023-10-06 DIAGNOSIS — D225 Melanocytic nevi of trunk: Secondary | ICD-10-CM | POA: Diagnosis not present

## 2023-10-06 DIAGNOSIS — L814 Other melanin hyperpigmentation: Secondary | ICD-10-CM | POA: Diagnosis not present

## 2023-10-06 DIAGNOSIS — D485 Neoplasm of uncertain behavior of skin: Secondary | ICD-10-CM | POA: Diagnosis not present

## 2023-11-15 DIAGNOSIS — C44311 Basal cell carcinoma of skin of nose: Secondary | ICD-10-CM | POA: Diagnosis not present

## 2023-12-07 ENCOUNTER — Inpatient Hospital Stay: Payer: Medicare PPO | Attending: Nurse Practitioner | Admitting: Nurse Practitioner

## 2023-12-07 ENCOUNTER — Encounter: Payer: Self-pay | Admitting: Nurse Practitioner

## 2023-12-07 VITALS — BP 138/76 | HR 79 | Temp 97.8°F | Resp 17 | Wt 117.2 lb

## 2023-12-07 DIAGNOSIS — Z923 Personal history of irradiation: Secondary | ICD-10-CM | POA: Insufficient documentation

## 2023-12-07 DIAGNOSIS — C44319 Basal cell carcinoma of skin of other parts of face: Secondary | ICD-10-CM | POA: Diagnosis not present

## 2023-12-07 DIAGNOSIS — F1090 Alcohol use, unspecified, uncomplicated: Secondary | ICD-10-CM | POA: Diagnosis not present

## 2023-12-07 DIAGNOSIS — F17210 Nicotine dependence, cigarettes, uncomplicated: Secondary | ICD-10-CM | POA: Diagnosis not present

## 2023-12-07 DIAGNOSIS — Z1231 Encounter for screening mammogram for malignant neoplasm of breast: Secondary | ICD-10-CM

## 2023-12-07 NOTE — Progress Notes (Signed)
 CLINIC:  Survivorship  REASON FOR VISIT:  Routine follow-up for history of skin cancer s/p head & neck radiation.  BRIEF ONCOLOGIC HISTORY:  Oncology History  Basal cell carcinoma of chin  12/05/2021 Cancer Staging   Staging form: Cutaneous Carcinoma of the Head and Neck, AJCC 8th Edition - Pathologic stage from 12/05/2021: Stage IV (pT4a, pNX, cM0) - Signed by Izell Domino, MD on 12/08/2021 Extraosseous extension: Present   12/08/2021 Initial Diagnosis   Basal cell carcinoma of chin   Radiation Treatment Dates: 04/08/2022 through 05/20/2022 Site Technique Total Dose (Gy) Dose per Fx (Gy) Completed Fx Beam Energies  Face: HN_chin IMRT 60/60 2 30/30 6X      INTERVAL HISTORY: Ms. Dooling presents for first survivorship meeting, doing well in general.  Had fat grafting by Dr. Elisabeth a few months ago, she is mostly pleased with the outcome.  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  -Pain: None -Nutrition/Diet: Normal -Dysphagia?:  None -Dental issues?:  None using fluoride trays?  Fluoride rinse -Weight: Stable  -Last derm visit: Dr. Dietrich, 10/06/2023 -Last Rad Onc visit: 06/2023 -Last Dentist visit: Q4 months    ADDITIONAL REVIEW OF SYSTEMS:  ROS    CURRENT MEDICATIONS:  Current Outpatient Medications on File Prior to Visit  Medication Sig Dispense Refill   acetaminophen  (TYLENOL ) 500 MG tablet Take 500 mg by mouth as needed (pain.).     atorvastatin  (LIPITOR) 10 MG tablet Take 10 mg by mouth every evening.     bismuth subsalicylate (PEPTO BISMOL) 262 MG/15ML suspension Take 30 mLs by mouth as needed for indigestion.     busPIRone  (BUSPAR ) 10 MG tablet Take 10 mg by mouth at bedtime.     Calcium  Carb-Cholecalciferol (CALCIUM  600+D3 PO) Take 1 tablet by mouth daily.     COLLAGEN-VITAMIN C PO Take 2 tablets by mouth daily.     Cyanocobalamin (VITAMIN B-12) 2500 MCG SUBL Take 2,500 mcg by mouth daily.     estrogens, conjugated, (PREMARIN)  0.625 MG tablet Take 0.3125 mg by mouth every evening.     ferrous sulfate 325 (65 FE) MG EC tablet Take 325 mg by mouth every other day.     gabapentin  (NEURONTIN ) 300 MG capsule Take 300 mg by mouth at bedtime as needed (restless leg syndrome per patient).     losartan  (COZAAR ) 25 MG tablet Take 12.5 mg by mouth every evening.     Magnesium 250 MG TABS Take 250 mg by mouth daily.     Naphazoline-Pheniramine (VISINE OP) Place 1 drop into both eyes as needed (redness).     Omega-3 Fatty Acids (FISH OIL) 1000 MG CAPS Take 1,000 mg by mouth daily.     sertraline  (ZOLOFT ) 50 MG tablet Take 75 mg by mouth every evening.     SUMAtriptan  (IMITREX ) 100 MG tablet Take 100 mg by mouth every 2 (two) hours as needed for migraine (max 2 doses/24hrs.). May repeat in 2 hours if headache persists or recurs.     triamcinolone (NASACORT) 55 MCG/ACT AERO nasal inhaler Place 1 spray into the nose daily.     No current facility-administered medications on file prior to visit.    ALLERGIES:  No Known Allergies   PHYSICAL EXAM:  Vitals:   12/07/23 1251  BP: 138/76  Pulse: 79  Resp: 17  Temp: 97.8 F (36.6 C)  SpO2: 98%   Filed Weights   12/07/23 1251  Weight: 117 lb 3.2 oz (53.2  kg)   General: Well-nourished, well-appearing female in no acute distress.  HEENT: Head is atraumatic and normocephalic.  Sclerae anicteric. Oral mucosa is pink and moist without lesions.  Tongue pink, moist, and midline. Oropharynx is pink and moist, without lesions.  Scar over the chin well-healed, no sign of recurrence.  Mucosa in the anterior mouth/gum appears healthy.  No sign of recurrence in the inner lip, gum, or floor of mouth Lymph: No preauricular, postauricular, cervical, supraclavicular, or infraclavicular lymphadenopathy noted on palpation.   Neck: No palpable masses.  No significant lymphadenopathy. Cardiovascular: Normal rate and rhythm. Respiratory: Clear to auscultation bilaterally; breathing non-labored.   Neuro: No focal deficits. Steady gait.   Psych: Normal mood and affect for situation. Extremities: No edema.  Skin: Warm and dry.    LABORATORY DATA:  None at this visit.  DIAGNOSTIC IMAGING:  None at this visit.    ASSESSMENT & PLAN:  Ms. Arzuaga is a pleasant 68 y.o. female with history of basal cell of the chin s/p excision and radiation completed 05/2022.  Patient presents to survivorship clinic today for long-term follow-up  #. Cancer:  Ms. Massett is clinically without evidence of disease or recurrence on physical exam today.     #. Nutritional status: Ms. Canner eats a normal diet, weight is stable.  #. At risk for dysphagia: No evidence, no need for SLP at this time  #.  At risk for neck lymphedema: Has benefited from PT referral and does exercises at home.  No significant lymphedema at this time  #. At risk for tooth decay/dental concerns: She has dry mouth, uses rinses and is established with a dentist whom she sees every 4 months   #.  Lung cancer screening:  [employer] now offers eligible patients lung cancer screening with a low-dose chest CT to aid in early detection, provide more effective treatment options, and ultimately improve survival benefits for patients diagnosed early.  Below is the selection criteria for screening:  Medicare patients: 55-77 years; privately insured patients 55-80 years. Active or former smokers who have quit within the last 15 years. 30+ pack-year history of smoking  Exclusion criteria - No signs/symptoms of lung cancer (i.e., no recent history of hemoptysis and no unexplained weight loss >15 pounds in the last 6 months). Willing and healthy enough to undergo biopsies/surgery if needed.  Ms. Cokley currently does meet criteria for lung cancer screening and is being ordered by PCP, last done 07/12/23 (benign)  #. Tobacco & alcohol use: Ms. Venturella drinks alcohol socially/rarely and smokes half a pack of cigarettes per day.  She is aware of the  health risks.  She quit smoking once but restarted and is not sure she could quit again.  I offered smoking cessation resources/referral to the program, she will think about it.   #. Health maintenance and wellness promotion: Cancer patients who consume a diet rich in fruits and vegetables have better overall health and decreased risk of cancer recurrence.  Encouraged to continue healthy active lifestyle.  She is followed by PCP and GI (for history of GI bleed and IDA).  Last mammogram pre-COVID, she agrees to referral today which was placed  #. Support services/counseling:  Ms. Shanahan appears to be coping well, she voices that she does not want to take a visit away from someone who needs it.  I validated her sentiment and encouraged her to follow with us , offered her an appointment in 1 year but she does not want to schedule at  this time.  She knows to call if needed.  The patient was offered support today through active listening and expressive supportive counseling.     Dispo:  -See derm q6 months, or sooner as needed -Return to cancer center to see Dr. Izell PRN -Continue f/up with PCP, dentist, GI and routine care team, age appropriate health, consider smoking cessation, etc -Referral for screening mammogram, denied any breast issues today -Return to cancer center to see Survivorship NP as needed, pt did not want to schedule a return visit    Orders Placed This Encounter  Procedures   MM 3D SCREENING MAMMOGRAM BILATERAL BREAST    Standing Status:   Future    Expected Date:   01/04/2024    Expiration Date:   12/06/2024    Reason for Exam (SYMPTOM  OR DIAGNOSIS REQUIRED):   screening    Preferred imaging location?:   [facility]     A total of 40 minutes was spent in the face-to-face care of this patient, with greater than 50% of that time spent in counseling and care-coordination.    Jazmynn Pho, NP Survivorship Program [facility] [phone]

## 2023-12-24 ENCOUNTER — Ambulatory Visit
Admission: RE | Admit: 2023-12-24 | Discharge: 2023-12-24 | Disposition: A | Discharge status: Home / Self Care | Payer: Medicare PPO | Source: Ambulatory Visit | Attending: Nurse Practitioner | Admitting: Nurse Practitioner

## 2023-12-24 DIAGNOSIS — Z1231 Encounter for screening mammogram for malignant neoplasm of breast: Secondary | ICD-10-CM | POA: Diagnosis not present

## 2023-12-29 ENCOUNTER — Other Ambulatory Visit: Payer: Self-pay | Admitting: Family Medicine

## 2023-12-29 DIAGNOSIS — R928 Other abnormal and inconclusive findings on diagnostic imaging of breast: Secondary | ICD-10-CM

## 2024-01-01 ENCOUNTER — Ambulatory Visit
Admission: RE | Admit: 2024-01-01 | Discharge: 2024-01-01 | Disposition: A | Discharge status: Home / Self Care | Payer: Medicare PPO | Source: Ambulatory Visit | Attending: Family Medicine | Admitting: Family Medicine

## 2024-01-01 DIAGNOSIS — R928 Other abnormal and inconclusive findings on diagnostic imaging of breast: Secondary | ICD-10-CM

## 2024-01-01 DIAGNOSIS — N6321 Unspecified lump in the left breast, upper outer quadrant: Secondary | ICD-10-CM | POA: Diagnosis not present

## 2024-01-01 DIAGNOSIS — N6311 Unspecified lump in the right breast, upper outer quadrant: Secondary | ICD-10-CM | POA: Diagnosis not present

## 2024-01-05 DIAGNOSIS — J069 Acute upper respiratory infection, unspecified: Secondary | ICD-10-CM | POA: Diagnosis not present

## 2024-04-10 DIAGNOSIS — Z85828 Personal history of other malignant neoplasm of skin: Secondary | ICD-10-CM | POA: Diagnosis not present

## 2024-04-10 DIAGNOSIS — D1801 Hemangioma of skin and subcutaneous tissue: Secondary | ICD-10-CM | POA: Diagnosis not present

## 2024-04-10 DIAGNOSIS — Z08 Encounter for follow-up examination after completed treatment for malignant neoplasm: Secondary | ICD-10-CM | POA: Diagnosis not present

## 2024-04-10 DIAGNOSIS — L821 Other seborrheic keratosis: Secondary | ICD-10-CM | POA: Diagnosis not present

## 2024-04-10 DIAGNOSIS — L814 Other melanin hyperpigmentation: Secondary | ICD-10-CM | POA: Diagnosis not present

## 2024-06-20 DIAGNOSIS — I10 Essential (primary) hypertension: Secondary | ICD-10-CM | POA: Diagnosis not present

## 2024-06-20 DIAGNOSIS — F419 Anxiety disorder, unspecified: Secondary | ICD-10-CM | POA: Diagnosis not present

## 2024-06-20 DIAGNOSIS — D508 Other iron deficiency anemias: Secondary | ICD-10-CM | POA: Diagnosis not present

## 2024-06-20 DIAGNOSIS — Z0000 Encounter for general adult medical examination without abnormal findings: Secondary | ICD-10-CM | POA: Diagnosis not present

## 2024-06-20 DIAGNOSIS — Z85828 Personal history of other malignant neoplasm of skin: Secondary | ICD-10-CM | POA: Diagnosis not present

## 2024-06-20 DIAGNOSIS — F17200 Nicotine dependence, unspecified, uncomplicated: Secondary | ICD-10-CM | POA: Diagnosis not present

## 2024-06-20 DIAGNOSIS — I7090 Unspecified atherosclerosis: Secondary | ICD-10-CM | POA: Diagnosis not present

## 2024-06-20 DIAGNOSIS — G43009 Migraine without aura, not intractable, without status migrainosus: Secondary | ICD-10-CM | POA: Diagnosis not present

## 2024-06-20 DIAGNOSIS — I700 Atherosclerosis of aorta: Secondary | ICD-10-CM | POA: Diagnosis not present

## 2024-06-21 ENCOUNTER — Other Ambulatory Visit: Payer: Self-pay | Admitting: Family Medicine

## 2024-06-21 DIAGNOSIS — F17200 Nicotine dependence, unspecified, uncomplicated: Secondary | ICD-10-CM

## 2024-06-28 DIAGNOSIS — H2513 Age-related nuclear cataract, bilateral: Secondary | ICD-10-CM | POA: Diagnosis not present

## 2024-06-28 DIAGNOSIS — H53143 Visual discomfort, bilateral: Secondary | ICD-10-CM | POA: Diagnosis not present

## 2024-06-28 DIAGNOSIS — H524 Presbyopia: Secondary | ICD-10-CM | POA: Diagnosis not present

## 2024-06-28 DIAGNOSIS — G43109 Migraine with aura, not intractable, without status migrainosus: Secondary | ICD-10-CM | POA: Diagnosis not present

## 2024-06-29 ENCOUNTER — Encounter: Payer: Self-pay | Admitting: Family Medicine

## 2024-07-13 ENCOUNTER — Ambulatory Visit
Admission: RE | Admit: 2024-07-13 | Discharge: 2024-07-13 | Disposition: A | Discharge status: Home / Self Care | Source: Ambulatory Visit | Attending: Family Medicine | Admitting: Family Medicine

## 2024-07-13 DIAGNOSIS — F17210 Nicotine dependence, cigarettes, uncomplicated: Secondary | ICD-10-CM | POA: Diagnosis not present

## 2024-07-13 DIAGNOSIS — F17200 Nicotine dependence, unspecified, uncomplicated: Secondary | ICD-10-CM

## 2024-10-10 DIAGNOSIS — L400 Psoriasis vulgaris: Secondary | ICD-10-CM | POA: Diagnosis not present

## 2024-10-10 DIAGNOSIS — L814 Other melanin hyperpigmentation: Secondary | ICD-10-CM | POA: Diagnosis not present

## 2024-10-10 DIAGNOSIS — L821 Other seborrheic keratosis: Secondary | ICD-10-CM | POA: Diagnosis not present

## 2024-10-10 DIAGNOSIS — D1801 Hemangioma of skin and subcutaneous tissue: Secondary | ICD-10-CM | POA: Diagnosis not present

## 2024-10-10 DIAGNOSIS — Z08 Encounter for follow-up examination after completed treatment for malignant neoplasm: Secondary | ICD-10-CM | POA: Diagnosis not present

## 2024-10-10 DIAGNOSIS — Z85828 Personal history of other malignant neoplasm of skin: Secondary | ICD-10-CM | POA: Diagnosis not present

## 2024-10-11 DIAGNOSIS — N951 Menopausal and female climacteric states: Secondary | ICD-10-CM | POA: Diagnosis not present

## 2024-10-11 DIAGNOSIS — N952 Postmenopausal atrophic vaginitis: Secondary | ICD-10-CM | POA: Diagnosis not present

## 2024-10-11 DIAGNOSIS — Z01419 Encounter for gynecological examination (general) (routine) without abnormal findings: Secondary | ICD-10-CM | POA: Diagnosis not present

## 2024-10-11 DIAGNOSIS — Z681 Body mass index (BMI) 19 or less, adult: Secondary | ICD-10-CM | POA: Diagnosis not present

## 2024-11-22 ENCOUNTER — Other Ambulatory Visit: Payer: Self-pay | Admitting: Family Medicine

## 2024-11-22 DIAGNOSIS — Z1231 Encounter for screening mammogram for malignant neoplasm of breast: Secondary | ICD-10-CM

## 2025-01-03 ENCOUNTER — Ambulatory Visit
